# Patient Record
Sex: Male | Born: 1965 | Race: White | Hispanic: No | Smoking: Never smoker
Health system: Southern US, Community
[De-identification: ages and names within clinical notes are randomized; demographics above are authoritative.]

## PROBLEM LIST (undated history)

## (undated) DIAGNOSIS — S2239XA Fracture of one rib, unspecified side, initial encounter for closed fracture: Secondary | ICD-10-CM

## (undated) DIAGNOSIS — Z9889 Other specified postprocedural states: Secondary | ICD-10-CM

---

## 2001-06-29 ENCOUNTER — Encounter: Payer: Self-pay | Admitting: Emergency Medicine

## 2001-06-29 ENCOUNTER — Emergency Department (HOSPITAL_COMMUNITY): Admission: EM | Admit: 2001-06-29 | Discharge: 2001-06-29 | Payer: Self-pay | Admitting: Emergency Medicine

## 2009-07-25 ENCOUNTER — Emergency Department (HOSPITAL_COMMUNITY): Admission: EM | Admit: 2009-07-25 | Discharge: 2009-07-25 | Payer: Self-pay | Admitting: Emergency Medicine

## 2010-07-29 ENCOUNTER — Emergency Department (HOSPITAL_COMMUNITY)
Admission: EM | Admit: 2010-07-29 | Discharge: 2010-07-29 | Disposition: A | Discharge status: Home / Self Care | Payer: PRIVATE HEALTH INSURANCE | Attending: Emergency Medicine | Admitting: Emergency Medicine

## 2010-07-29 DIAGNOSIS — T148XXA Other injury of unspecified body region, initial encounter: Secondary | ICD-10-CM | POA: Insufficient documentation

## 2010-07-29 DIAGNOSIS — M545 Low back pain, unspecified: Secondary | ICD-10-CM | POA: Insufficient documentation

## 2010-07-29 DIAGNOSIS — X500XXA Overexertion from strenuous movement or load, initial encounter: Secondary | ICD-10-CM | POA: Insufficient documentation

## 2010-07-29 DIAGNOSIS — M62838 Other muscle spasm: Secondary | ICD-10-CM | POA: Insufficient documentation

## 2019-03-06 ENCOUNTER — Ambulatory Visit: Payer: PRIVATE HEALTH INSURANCE | Attending: Internal Medicine

## 2019-03-06 ENCOUNTER — Other Ambulatory Visit: Payer: Self-pay

## 2019-03-06 DIAGNOSIS — Z20822 Contact with and (suspected) exposure to covid-19: Secondary | ICD-10-CM

## 2019-03-07 LAB — NOVEL CORONAVIRUS, NAA: SARS-CoV-2, NAA: NOT DETECTED

## 2019-03-08 ENCOUNTER — Telehealth: Payer: Self-pay | Admitting: *Deleted

## 2019-03-08 NOTE — Telephone Encounter (Signed)
Pt called in requesting his COVID-19 test result.   I let him know it was not detected meaning he did not have the virus.  I gave him the [facility] number he needed to call in order to have his result sent to him.   He is not active on MyChart. He thanked me very much for my help.

## 2020-07-15 ENCOUNTER — Encounter (HOSPITAL_COMMUNITY): Payer: Self-pay

## 2020-07-15 ENCOUNTER — Inpatient Hospital Stay (HOSPITAL_COMMUNITY)
Admission: EM | Admit: 2020-07-15 | Discharge: 2020-07-17 | DRG: 494 | Disposition: A | Discharge status: Home / Self Care | Payer: Self-pay | Attending: Student | Admitting: Student

## 2020-07-15 ENCOUNTER — Emergency Department (HOSPITAL_COMMUNITY): Payer: Self-pay

## 2020-07-15 DIAGNOSIS — T148XXA Other injury of unspecified body region, initial encounter: Secondary | ICD-10-CM

## 2020-07-15 DIAGNOSIS — S42402A Unspecified fracture of lower end of left humerus, initial encounter for closed fracture: Secondary | ICD-10-CM | POA: Diagnosis present

## 2020-07-15 DIAGNOSIS — Z23 Encounter for immunization: Secondary | ICD-10-CM

## 2020-07-15 DIAGNOSIS — Z419 Encounter for procedure for purposes other than remedying health state, unspecified: Secondary | ICD-10-CM

## 2020-07-15 DIAGNOSIS — S022XXB Fracture of nasal bones, initial encounter for open fracture: Secondary | ICD-10-CM

## 2020-07-15 DIAGNOSIS — Z20822 Contact with and (suspected) exposure to covid-19: Secondary | ICD-10-CM | POA: Diagnosis present

## 2020-07-15 DIAGNOSIS — Y9355 Activity, bike riding: Secondary | ICD-10-CM

## 2020-07-15 DIAGNOSIS — S022XXA Fracture of nasal bones, initial encounter for closed fracture: Secondary | ICD-10-CM | POA: Diagnosis present

## 2020-07-15 DIAGNOSIS — S42412A Displaced simple supracondylar fracture without intercondylar fracture of left humerus, initial encounter for closed fracture: Secondary | ICD-10-CM | POA: Diagnosis not present

## 2020-07-15 HISTORY — DX: Other specified postprocedural states: Z98.890

## 2020-07-15 LAB — BASIC METABOLIC PANEL
Anion gap: 9 (ref 5–15)
BUN: 14 mg/dL (ref 6–20)
CO2: 24 mmol/L (ref 22–32)
Calcium: 8.9 mg/dL (ref 8.9–10.3)
Chloride: 107 mmol/L (ref 98–111)
Creatinine, Ser: 0.83 mg/dL (ref 0.61–1.24)
GFR, Estimated: >60 mL/min (ref >60)
Glucose, Bld: 103 mg/dL — ABNORMAL HIGH (ref 70–99)
Potassium: 3 mmol/L — ABNORMAL LOW (ref 3.5–5.1)
Sodium: 140 mmol/L (ref 135–145)

## 2020-07-15 LAB — CBC
HCT: 40.8 % (ref 39.0–52.0)
Hemoglobin: 13.9 g/dL (ref 13.0–17.0)
MCH: 32 pg (ref 26.0–34.0)
MCHC: 34.1 g/dL (ref 30.0–36.0)
MCV: 93.8 fL (ref 80.0–100.0)
Platelets: 247 10*3/uL (ref 150–400)
RBC: 4.35 MIL/uL (ref 4.22–5.81)
RDW: 12.6 % (ref 11.5–15.5)
WBC: 14.6 10*3/uL — ABNORMAL HIGH (ref 4.0–10.5)
nRBC: 0 % (ref 0.0–0.2)

## 2020-07-15 LAB — RESP PANEL BY RT-PCR (FLU A&B, COVID) ARPGX2
Influenza A by PCR: NEGATIVE
Influenza B by PCR: NEGATIVE
SARS Coronavirus 2 by RT PCR: NEGATIVE

## 2020-07-15 LAB — HIV ANTIBODY (ROUTINE TESTING W REFLEX): HIV Screen 4th Generation wRfx: NONREACTIVE

## 2020-07-15 MED ORDER — OXYCODONE HCL 5 MG PO TABS
5.0000 mg | ORAL_TABLET | ORAL | Status: DC | PRN
  Administered 2020-07-16: 5 mg via ORAL
  Filled 2020-07-15 (×2): qty 1

## 2020-07-15 MED ORDER — MAGNESIUM CITRATE PO SOLN: 1.0000 | Freq: Once | ORAL | Status: DC | PRN

## 2020-07-15 MED ORDER — ONDANSETRON HCL 4 MG/2ML IJ SOLN
4.0000 mg | Freq: Four times a day (QID) | INTRAMUSCULAR | Status: DC | PRN
  Administered 2020-07-16: 4 mg via INTRAVENOUS

## 2020-07-15 MED ORDER — METHOCARBAMOL 1000 MG/10ML IJ SOLN
500.0000 mg | Freq: Four times a day (QID) | INTRAVENOUS | Status: DC | PRN
  Filled 2020-07-15: qty 5

## 2020-07-15 MED ORDER — TETANUS-DIPHTH-ACELL PERTUSSIS 5-2.5-18.5 LF-MCG/0.5 IM SUSY
0.5000 mL | PREFILLED_SYRINGE | Freq: Once | INTRAMUSCULAR | Status: AC
  Administered 2020-07-15: 0.5 mL via INTRAMUSCULAR
  Filled 2020-07-15: qty 0.5

## 2020-07-15 MED ORDER — ACETAMINOPHEN 325 MG PO TABS: 325.0000 mg | ORAL_TABLET | Freq: Four times a day (QID) | ORAL | Status: DC | PRN

## 2020-07-15 MED ORDER — METHOCARBAMOL 500 MG PO TABS
500.0000 mg | ORAL_TABLET | Freq: Four times a day (QID) | ORAL | Status: DC | PRN
  Administered 2020-07-16: 500 mg via ORAL
  Filled 2020-07-15 (×2): qty 1

## 2020-07-15 MED ORDER — DOCUSATE SODIUM 100 MG PO CAPS
100.0000 mg | ORAL_CAPSULE | Freq: Two times a day (BID) | ORAL | Status: DC
  Filled 2020-07-15 (×2): qty 1

## 2020-07-15 MED ORDER — ONDANSETRON HCL 4 MG PO TABS: 4.0000 mg | ORAL_TABLET | Freq: Four times a day (QID) | ORAL | Status: DC | PRN

## 2020-07-15 MED ORDER — OXYCODONE HCL 5 MG PO TABS: 10.0000 mg | ORAL_TABLET | ORAL | Status: DC | PRN

## 2020-07-15 MED ORDER — POLYETHYLENE GLYCOL 3350 17 G PO PACK: 17.0000 g | PACK | Freq: Every day | ORAL | Status: DC | PRN

## 2020-07-15 MED ORDER — ACETAMINOPHEN 500 MG PO TABS
1000.0000 mg | ORAL_TABLET | Freq: Four times a day (QID) | ORAL | Status: DC
  Administered 2020-07-16: 1000 mg via ORAL
  Filled 2020-07-15: qty 2

## 2020-07-15 MED ORDER — METOCLOPRAMIDE HCL 5 MG/ML IJ SOLN: 5.0000 mg | Freq: Three times a day (TID) | INTRAMUSCULAR | Status: DC | PRN

## 2020-07-15 MED ORDER — HYDROMORPHONE HCL 1 MG/ML IJ SOLN
0.5000 mg | INTRAMUSCULAR | Status: DC | PRN
  Administered 2020-07-15: 1 mg via INTRAVENOUS
  Filled 2020-07-15: qty 1

## 2020-07-15 MED ORDER — SODIUM CHLORIDE 0.9 % IV SOLN: INTRAVENOUS | Status: DC

## 2020-07-15 MED ORDER — FENTANYL CITRATE (PF) 100 MCG/2ML IJ SOLN
50.0000 ug | Freq: Once | INTRAMUSCULAR | Status: AC
  Administered 2020-07-15: 50 ug via INTRAVENOUS
  Filled 2020-07-15: qty 2

## 2020-07-15 MED ORDER — METOCLOPRAMIDE HCL 10 MG PO TABS
5.0000 mg | ORAL_TABLET | Freq: Three times a day (TID) | ORAL | Status: DC | PRN
  Filled 2020-07-15: qty 1

## 2020-07-15 MED ORDER — SORBITOL 70 % SOLN
30.0000 mL | Freq: Every day | Status: DC | PRN
  Filled 2020-07-15: qty 30

## 2020-07-15 MED ORDER — DIPHENHYDRAMINE HCL 12.5 MG/5ML PO ELIX: 12.5000 mg | ORAL_SOLUTION | ORAL | Status: DC | PRN
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
## 2020-07-15 NOTE — Anesthesia Preprocedure Evaluation (Addendum)
Anesthesia Evaluation  Patient identified by MRN, date of birth, ID band Patient awake    Reviewed: Allergy & Precautions, NPO status , Patient's Chart, lab work & pertinent test results  History of Anesthesia Complications Negative for: history of anesthetic complications  Airway Mallampati: II  TM Distance: >3 FB Neck ROM: Full    Dental no notable dental hx. (+) Dental Advisory Given   Pulmonary neg pulmonary ROS,    Pulmonary exam normal        Cardiovascular negative cardio ROS Normal cardiovascular exam     Neuro/Psych negative neurological ROS     GI/Hepatic negative GI ROS, Neg liver ROS,   Endo/Other  negative endocrine ROS  Renal/GU negative Renal ROS     Musculoskeletal   Abdominal   Peds  Hematology negative hematology ROS (+)   Anesthesia Other Findings   Reproductive/Obstetrics                            Anesthesia Physical Anesthesia Plan  ASA: 2  Anesthesia Plan: General   Post-op Pain Management:  Regional for Post-op pain   Induction:   PONV Risk Score and Plan: 2 and Ondansetron and Dexamethasone  Airway Management Planned: Oral ETT and LMA  Additional Equipment:   Intra-op Plan:   Post-operative Plan: Extubation in OR  Informed Consent:   Plan Discussed with: Anesthesiologist and CRNA  Anesthesia Plan Comments:        Anesthesia Quick Evaluation
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
## 2020-07-15 NOTE — Progress Notes (Signed)
Orthopedic Tech Progress Note Patient Details:  Ricardo Lozano 09-03-1965 [id number]  Applied splint In a comfortable position for patient  Ortho Devices Type of Ortho Device: Long arm splint Ortho Device/Splint Location: LUE Ortho Device/Splint Interventions: Ordered, Application, Adjustment   Post Interventions Patient Tolerated: Well Instructions Provided: Care of device  Donald Pore 07/15/2020, 7:34 PM

## 2020-07-16 ENCOUNTER — Observation Stay (HOSPITAL_COMMUNITY): Payer: Self-pay

## 2020-07-16 ENCOUNTER — Observation Stay (HOSPITAL_COMMUNITY): Payer: Self-pay | Admitting: Anesthesiology

## 2020-07-16 ENCOUNTER — Other Ambulatory Visit: Payer: Self-pay

## 2020-07-16 ENCOUNTER — Encounter (HOSPITAL_COMMUNITY)
Admission: EM | Disposition: A | Discharge status: Home / Self Care | Payer: Self-pay | Source: Home / Self Care | Attending: Orthopaedic Surgery

## 2020-07-16 DIAGNOSIS — Y9355 Activity, bike riding: Secondary | ICD-10-CM | POA: Diagnosis not present

## 2020-07-16 DIAGNOSIS — S42402A Unspecified fracture of lower end of left humerus, initial encounter for closed fracture: Secondary | ICD-10-CM | POA: Diagnosis present

## 2020-07-16 DIAGNOSIS — Z20822 Contact with and (suspected) exposure to covid-19: Secondary | ICD-10-CM | POA: Diagnosis present

## 2020-07-16 DIAGNOSIS — S022XXA Fracture of nasal bones, initial encounter for closed fracture: Secondary | ICD-10-CM | POA: Diagnosis present

## 2020-07-16 DIAGNOSIS — S42412A Displaced simple supracondylar fracture without intercondylar fracture of left humerus, initial encounter for closed fracture: Secondary | ICD-10-CM | POA: Diagnosis present

## 2020-07-16 DIAGNOSIS — Z23 Encounter for immunization: Secondary | ICD-10-CM | POA: Diagnosis not present

## 2020-07-16 HISTORY — PX: ORIF HUMERUS FRACTURE: SHX2126

## 2020-07-16 SURGERY — OPEN REDUCTION INTERNAL FIXATION (ORIF) DISTAL HUMERUS FRACTURE
Anesthesia: General | Laterality: Left

## 2020-07-16 MED ORDER — VANCOMYCIN HCL 1000 MG IV SOLR
INTRAVENOUS | Status: AC
  Filled 2020-07-16: qty 1000

## 2020-07-16 MED ORDER — BUPIVACAINE HCL (PF) 0.5 % IJ SOLN
INTRAMUSCULAR | Status: DC | PRN
  Administered 2020-07-16: 15 mL via PERINEURAL

## 2020-07-16 MED ORDER — CELECOXIB 200 MG PO CAPS
200.0000 mg | ORAL_CAPSULE | Freq: Once | ORAL | Status: AC
  Administered 2020-07-16: 200 mg via ORAL
  Filled 2020-07-16: qty 1

## 2020-07-16 MED ORDER — MIDAZOLAM HCL 2 MG/2ML IJ SOLN
INTRAMUSCULAR | Status: AC
  Filled 2020-07-16: qty 2

## 2020-07-16 MED ORDER — ACETAMINOPHEN 500 MG PO TABS
1000.0000 mg | ORAL_TABLET | Freq: Four times a day (QID) | ORAL | Status: DC
  Administered 2020-07-16 – 2020-07-17 (×4): 1000 mg via ORAL
  Filled 2020-07-16 (×4): qty 2

## 2020-07-16 MED ORDER — ASPIRIN EC 81 MG PO TBEC
81.0000 mg | DELAYED_RELEASE_TABLET | Freq: Every day | ORAL | Status: DC
  Administered 2020-07-17: 81 mg via ORAL
  Filled 2020-07-16: qty 1

## 2020-07-16 MED ORDER — AMISULPRIDE (ANTIEMETIC) 5 MG/2ML IV SOLN: 10.0000 mg | Freq: Once | INTRAVENOUS | Status: DC | PRN

## 2020-07-16 MED ORDER — PHENYLEPHRINE HCL-NACL 10-0.9 MG/250ML-% IV SOLN
INTRAVENOUS | Status: DC | PRN
  Administered 2020-07-16: 30 ug/min via INTRAVENOUS

## 2020-07-16 MED ORDER — CEFAZOLIN SODIUM-DEXTROSE 2-4 GM/100ML-% IV SOLN
INTRAVENOUS | Status: AC
  Filled 2020-07-16: qty 100

## 2020-07-16 MED ORDER — FENTANYL CITRATE (PF) 250 MCG/5ML IJ SOLN
INTRAMUSCULAR | Status: DC | PRN
  Administered 2020-07-16: 100 ug via INTRAVENOUS

## 2020-07-16 MED ORDER — MIDAZOLAM HCL 2 MG/2ML IJ SOLN
INTRAMUSCULAR | Status: DC | PRN
  Administered 2020-07-16: 2 mg via INTRAVENOUS

## 2020-07-16 MED ORDER — VANCOMYCIN HCL 1000 MG IV SOLR
INTRAVENOUS | Status: DC | PRN
  Administered 2020-07-16: 1000 mg

## 2020-07-16 MED ORDER — FENTANYL CITRATE (PF) 250 MCG/5ML IJ SOLN
INTRAMUSCULAR | Status: AC
  Filled 2020-07-16: qty 5

## 2020-07-16 MED ORDER — CEFAZOLIN SODIUM-DEXTROSE 2-3 GM-%(50ML) IV SOLR
INTRAVENOUS | Status: DC | PRN
  Administered 2020-07-16: 2 g via INTRAVENOUS

## 2020-07-16 MED ORDER — ACETAMINOPHEN 500 MG PO TABS
1000.0000 mg | ORAL_TABLET | Freq: Once | ORAL | Status: DC
  Filled 2020-07-16: qty 2

## 2020-07-16 MED ORDER — SUGAMMADEX SODIUM 200 MG/2ML IV SOLN
INTRAVENOUS | Status: DC | PRN
  Administered 2020-07-16: 200 mg via INTRAVENOUS

## 2020-07-16 MED ORDER — FENTANYL CITRATE (PF) 100 MCG/2ML IJ SOLN
INTRAMUSCULAR | Status: AC
  Filled 2020-07-16: qty 2

## 2020-07-16 MED ORDER — 0.9 % SODIUM CHLORIDE (POUR BTL) OPTIME
TOPICAL | Status: DC | PRN
  Administered 2020-07-16: 1000 mL

## 2020-07-16 MED ORDER — PHENYLEPHRINE 40 MCG/ML (10ML) SYRINGE FOR IV PUSH (FOR BLOOD PRESSURE SUPPORT)
PREFILLED_SYRINGE | INTRAVENOUS | Status: DC | PRN
  Administered 2020-07-16 (×2): 40 ug via INTRAVENOUS

## 2020-07-16 MED ORDER — LACTATED RINGERS IV SOLN: INTRAVENOUS | Status: DC | PRN

## 2020-07-16 MED ORDER — PROMETHAZINE HCL 25 MG/ML IJ SOLN: 6.2500 mg | INTRAMUSCULAR | Status: DC | PRN

## 2020-07-16 MED ORDER — DEXAMETHASONE SODIUM PHOSPHATE 10 MG/ML IJ SOLN
INTRAMUSCULAR | Status: DC | PRN
  Administered 2020-07-16: 10 mg via INTRAVENOUS

## 2020-07-16 MED ORDER — CEFAZOLIN SODIUM-DEXTROSE 2-4 GM/100ML-% IV SOLN
2.0000 g | Freq: Three times a day (TID) | INTRAVENOUS | Status: AC
  Administered 2020-07-16 – 2020-07-17 (×3): 2 g via INTRAVENOUS
  Filled 2020-07-16 (×3): qty 100

## 2020-07-16 MED ORDER — BUPIVACAINE LIPOSOME 1.3 % IJ SUSP
INTRAMUSCULAR | Status: DC | PRN
  Administered 2020-07-16: 10 mL via PERINEURAL

## 2020-07-16 MED ORDER — ROCURONIUM BROMIDE 10 MG/ML (PF) SYRINGE
PREFILLED_SYRINGE | INTRAVENOUS | Status: DC | PRN
  Administered 2020-07-16: 100 mg via INTRAVENOUS

## 2020-07-16 MED ORDER — FENTANYL CITRATE (PF) 100 MCG/2ML IJ SOLN: 25.0000 ug | INTRAMUSCULAR | Status: DC | PRN

## 2020-07-16 MED ORDER — PROPOFOL 10 MG/ML IV BOLUS
INTRAVENOUS | Status: AC
  Filled 2020-07-16: qty 20

## 2020-07-16 MED ORDER — PROPOFOL 10 MG/ML IV BOLUS
INTRAVENOUS | Status: DC | PRN
  Administered 2020-07-16: 200 mg via INTRAVENOUS

## 2020-07-16 SURGICAL SUPPLY — 93 items
ADH SKN CLS APL DERMABOND .7 (GAUZE/BANDAGES/DRESSINGS) ×1
BAG COUNTER SPONGE SURGICOUNT (BAG) ×2 IMPLANT
BAG SPNG CNTER NS LX DISP (BAG) ×1
BIT DRILL 2.0 (BIT) ×2
BIT DRILL 2.5X2.75 QC CALB (BIT) ×2 IMPLANT
BIT DRILL 2XNS DISP SS SM FRAG (BIT) ×1 IMPLANT
BIT DRILL 4.8X200 CANN (BIT) ×2 IMPLANT
BIT DRILL CALIBRATED 2.7 (BIT) ×4 IMPLANT
BIT DRL 2XNS DISP SS SM FRAG (BIT) ×1
BLADE AVERAGE 25X9 (BLADE) IMPLANT
BNDG COHESIVE 4X5 TAN STRL (GAUZE/BANDAGES/DRESSINGS) ×2 IMPLANT
BNDG ELASTIC 3X5.8 VLCR STR LF (GAUZE/BANDAGES/DRESSINGS) ×2 IMPLANT
BNDG ELASTIC 4X5.8 VLCR STR LF (GAUZE/BANDAGES/DRESSINGS) ×2 IMPLANT
BNDG ESMARK 4X9 LF (GAUZE/BANDAGES/DRESSINGS) ×2 IMPLANT
BNDG GAUZE ELAST 4 BULKY (GAUZE/BANDAGES/DRESSINGS) ×4 IMPLANT
BRUSH SCRUB EZ PLAIN DRY (MISCELLANEOUS) ×2 IMPLANT
CLEANER TIP ELECTROSURG 2X2 (MISCELLANEOUS) ×2 IMPLANT
CORD BIPOLAR FORCEPS 12FT (ELECTRODE) IMPLANT
COVER SURGICAL LIGHT HANDLE (MISCELLANEOUS) ×2 IMPLANT
DERMABOND ADVANCED (GAUZE/BANDAGES/DRESSINGS) ×1
DERMABOND ADVANCED .7 DNX12 (GAUZE/BANDAGES/DRESSINGS) ×1 IMPLANT
DRAIN PENROSE 1/4X12 LTX STRL (WOUND CARE) IMPLANT
DRAPE C-ARM 42X72 X-RAY (DRAPES) ×2 IMPLANT
DRAPE C-ARMOR (DRAPES) ×2 IMPLANT
DRAPE HALF SHEET 40X57 (DRAPES) ×2 IMPLANT
DRAPE INCISE IOBAN 66X45 STRL (DRAPES) IMPLANT
DRAPE ORTHO SPLIT 77X108 STRL (DRAPES) ×4
DRAPE SURG ORHT 6 SPLT 77X108 (DRAPES) ×2 IMPLANT
DRAPE U-SHAPE 47X51 STRL (DRAPES) ×4 IMPLANT
DRSG ADAPTIC 3X8 NADH LF (GAUZE/BANDAGES/DRESSINGS) IMPLANT
DRSG MEPILEX BORDER 4X12 (GAUZE/BANDAGES/DRESSINGS) ×2 IMPLANT
DRSG PAD ABDOMINAL 8X10 ST (GAUZE/BANDAGES/DRESSINGS) ×2 IMPLANT
ELECT REM PT RETURN 9FT ADLT (ELECTROSURGICAL) ×2
ELECTRODE REM PT RTRN 9FT ADLT (ELECTROSURGICAL) ×1 IMPLANT
GAUZE SPONGE 4X4 12PLY STRL (GAUZE/BANDAGES/DRESSINGS) ×4 IMPLANT
GLOVE SURG ENC MOIS LTX SZ6.5 (GLOVE) ×6 IMPLANT
GLOVE SURG ENC MOIS LTX SZ7.5 (GLOVE) ×6 IMPLANT
GLOVE SURG UNDER POLY LF SZ6.5 (GLOVE) ×2 IMPLANT
GLOVE SURG UNDER POLY LF SZ7.5 (GLOVE) ×2 IMPLANT
GOWN STRL REUS W/ TWL LRG LVL3 (GOWN DISPOSABLE) ×3 IMPLANT
GOWN STRL REUS W/ TWL XL LVL3 (GOWN DISPOSABLE) IMPLANT
GOWN STRL REUS W/TWL LRG LVL3 (GOWN DISPOSABLE) ×6
GOWN STRL REUS W/TWL XL LVL3 (GOWN DISPOSABLE)
K-WIRE FIXATION 2.0X6 (WIRE) ×4
KIT BASIN OR (CUSTOM PROCEDURE TRAY) ×2 IMPLANT
KIT TURNOVER KIT B (KITS) ×2 IMPLANT
KWIRE FIXATION 2.0X6 (WIRE) ×2 IMPLANT
LOOP VESSEL MAXI BLUE (MISCELLANEOUS) ×2 IMPLANT
MANIFOLD NEPTUNE II (INSTRUMENTS) ×2 IMPLANT
NEEDLE HYPO 25X1 1.5 SAFETY (NEEDLE) IMPLANT
NS IRRIG 1000ML POUR BTL (IV SOLUTION) ×2 IMPLANT
PACK ORTHO EXTREMITY (CUSTOM PROCEDURE TRAY) ×2 IMPLANT
PAD ARMBOARD 7.5X6 YLW CONV (MISCELLANEOUS) ×4 IMPLANT
PIN GUIDE DRILL TIP 2.8X300 (DRILL) ×2 IMPLANT
PLATE LG LF MEDIAL DIS HUMERUS (Plate) ×2 IMPLANT
PLATE LG LF POST LAT DIS HUMER (Plate) ×2 IMPLANT
SCREW CANNULATED 6.5X115MM (Screw) ×2 IMPLANT
SCREW CORTICAL 2.7 MM 22MM (Screw) ×2 IMPLANT
SCREW CORTICAL 2.7MM  24MM (Screw) ×4 IMPLANT
SCREW CORTICAL 2.7MM  40MM (Screw) ×2 IMPLANT
SCREW CORTICAL 2.7MM 24MM (Screw) ×2 IMPLANT
SCREW CORTICAL 2.7MM 40MM (Screw) ×1 IMPLANT
SCREW CORTICAL 2.7MM 50MM (Screw) ×2 IMPLANT
SCREW CORTICAL 3.5MM  30MM (Screw) ×2 IMPLANT
SCREW CORTICAL 3.5MM 22MM (Screw) ×2 IMPLANT
SCREW CORTICAL 3.5MM 24MM (Screw) ×10 IMPLANT
SCREW CORTICAL 3.5MM 26MM (Screw) ×2 IMPLANT
SCREW CORTICAL 3.5MM 30MM (Screw) ×1 IMPLANT
SCREW LOCK CORT STAR 3.5X18 (Screw) ×8 IMPLANT
SCREW LOCK CORT STAR 3.5X22 (Screw) ×2 IMPLANT
SCREW LOCK CORT STAR 3.5X48 (Screw) ×4 IMPLANT
SCREW LOW PROF TIS 3.5X28MM (Screw) ×2 IMPLANT
SCREW LP NL T15 3.5X24 (Screw) ×2 IMPLANT
SCREW T15 MD 3.5X48MM NS (Screw) ×2 IMPLANT
SLING ARM IMMOBILIZER LRG (SOFTGOODS) ×2 IMPLANT
SPONGE T-LAP 18X18 ~~LOC~~+RFID (SPONGE) ×4 IMPLANT
STAPLER VISISTAT 35W (STAPLE) IMPLANT
STOCKINETTE IMPERVIOUS 9X36 MD (GAUZE/BANDAGES/DRESSINGS) ×2 IMPLANT
SUCTION FRAZIER HANDLE 10FR (MISCELLANEOUS) ×2
SUCTION TUBE FRAZIER 10FR DISP (MISCELLANEOUS) ×1 IMPLANT
SUT ETHILON 3 0 PS 1 (SUTURE) ×4 IMPLANT
SUT MNCRL AB 3-0 PS2 27 (SUTURE) ×2 IMPLANT
SUT VIC AB 0 CT1 27 (SUTURE) ×4
SUT VIC AB 0 CT1 27XBRD ANBCTR (SUTURE) ×2 IMPLANT
SUT VIC AB 2-0 CT1 27 (SUTURE) ×4
SUT VIC AB 2-0 CT1 TAPERPNT 27 (SUTURE) ×2 IMPLANT
SYR CONTROL 10ML LL (SYRINGE) IMPLANT
TOWEL GREEN STERILE (TOWEL DISPOSABLE) ×2 IMPLANT
TOWEL GREEN STERILE FF (TOWEL DISPOSABLE) ×2 IMPLANT
TRAY FOLEY MTR SLVR 16FR STAT (SET/KITS/TRAYS/PACK) ×2 IMPLANT
WASHER FLAT 6.5MM (Washer) ×2 IMPLANT
WATER STERILE IRR 1000ML POUR (IV SOLUTION) ×2 IMPLANT
YANKAUER SUCT BULB TIP NO VENT (SUCTIONS) IMPLANT

## 2020-07-16 NOTE — Anesthesia Procedure Notes (Addendum)
Anesthesia Regional Block: Supraclavicular block   Pre-Anesthetic Checklist: , timeout performed,  Correct Patient, Correct Site, Correct Laterality,  Correct Procedure, Correct Position, site marked,  Risks and benefits discussed,  Surgical consent,  Pre-op evaluation,  At surgeon's request and post-op pain management  Laterality: Left  Prep: chloraprep       Needles:  Injection technique: Single-shot  Needle Type: Echogenic Stimulator Needle     Needle Length: 5cm  Needle Gauge: 22     Additional Needles:   Narrative:  Start time: 07/16/2020 8:11 AM End time: 07/16/2020 8:01 AM Injection made incrementally with aspirations every 5 mL.  Performed by: Personally  Anesthesiologist: Heather Roberts, MD  Additional Notes: Functioning IV was confirmed and monitors applied.  A 13mm 22ga echogenic arrow stimulator was used. Sterile prep and drape,hand hygiene and sterile gloves were used.Ultrasound guidance: relevant anatomy identified, needle position confirmed, local anesthetic spread visualized around nerve(s)., vascular puncture avoided.  Image printed for medical record.  Negative aspiration and negative test dose prior to incremental administration of local anesthetic. The patient tolerated the procedure well.

## 2020-07-16 NOTE — H&P (Signed)
I reviewed the case with Dr. Jena Gauss last night and he has agreed to perform surgical repair this morning.
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
## 2020-07-16 NOTE — Discharge Instructions (Addendum)
Orthopaedic Trauma Service Discharge Instructions   General Discharge Instructions  WEIGHT BEARING STATUS: Non-weightbearing left upper extremity  RANGE OF MOTION/ACTIVITY:ok for passive and active range of motion of elbow. Wear sling for comfort  Wound Care: You may remove your surgical dressing on po-op day #2 (Friday 07/18/20). Incisions can be left open to air if there is no drainage. If incision continues to have drainage, follow wound care instructions below. Okay to shower if no drainage from incisions.  DVT/PE prophylaxis: Aspirin  Diet: as you were eating previously.  Can use over the counter stool softeners and bowel preparations, such as Miralax, to help with bowel movements.  Narcotics can be constipating.  Be sure to drink plenty of fluids  PAIN MEDICATION USE AND EXPECTATIONS  You have likely been given narcotic medications to help control your pain.  After a traumatic event that results in an fracture (broken bone) with or without surgery, it is ok to use narcotic pain medications to help control one's pain.  We understand that everyone responds to pain differently and each individual patient will be evaluated on a regular basis for the continued need for narcotic medications. Ideally, narcotic medication use should last no more than 6-8 weeks (coinciding with fracture healing).   As a patient it is your responsibility as well to monitor narcotic medication use and report the amount and frequency you use these medications when you come to your office visit.   We would also advise that if you are using narcotic medications, you should take a dose prior to therapy to maximize you participation.  IF YOU ARE ON NARCOTIC MEDICATIONS IT IS NOT PERMISSIBLE TO OPERATE A MOTOR VEHICLE (MOTORCYCLE/CAR/TRUCK/MOPED) OR HEAVY MACHINERY DO NOT MIX NARCOTICS WITH OTHER CNS (CENTRAL NERVOUS SYSTEM) DEPRESSANTS SUCH AS ALCOHOL   STOP SMOKING OR USING NICOTINE PRODUCTS!!!!  As discussed  nicotine severely impairs your body's ability to heal surgical and traumatic wounds but also impairs bone healing.  Wounds and bone heal by forming microscopic blood vessels (angiogenesis) and nicotine is a vasoconstrictor (essentially, shrinks blood vessels).  Therefore, if vasoconstriction occurs to these microscopic blood vessels they essentially disappear and are unable to deliver necessary nutrients to the healing tissue.  This is one modifiable factor that you can do to dramatically increase your chances of healing your injury.    (This means no smoking, no nicotine gum, patches, etc)  DO NOT USE NONSTEROIDAL ANTI-INFLAMMATORY DRUGS (NSAID'S)  Using products such as Advil (ibuprofen), Aleve (naproxen), Motrin (ibuprofen) for additional pain control during fracture healing can delay and/or prevent the healing response.  If you would like to take over the counter (OTC) medication, Tylenol (acetaminophen) is ok.  However, some narcotic medications that are given for pain control contain acetaminophen as well. Therefore, you should not exceed more than 4000 mg of tylenol in a day if you do not have liver disease.  Also note that there are may OTC medicines, such as cold medicines and allergy medicines that my contain tylenol as well.  If you have any questions about medications and/or interactions please ask your doctor/PA or your pharmacist.      ICE AND ELEVATE INJURED/OPERATIVE EXTREMITY  Using ice and elevating the injured extremity above your heart can help with swelling and pain control.  Icing in a pulsatile fashion, such as 20 minutes on and 20 minutes off, can be followed.    Do not place ice directly on skin. Make sure there is a barrier between to skin  and the ice pack.    Using frozen items such as frozen peas works well as the conform nicely to the are that needs to be iced.  USE AN ACE WRAP OR TED HOSE FOR SWELLING CONTROL  In addition to icing and elevation, Ace wraps or TED hose are  used to help limit and resolve swelling.  It is recommended to use Ace wraps or TED hose until you are informed to stop.    When using Ace Wraps start the wrapping distally (farthest away from the body) and wrap proximally (closer to the body)   Example: If you had surgery on your leg or thing and you do not have a splint on, start the ace wrap at the toes and work your way up to the thigh        If you had surgery on your upper extremity and do not have a splint on, start the ace wrap at your fingers and work your way up to the upper arm  CALL THE OFFICE WITH ANY QUESTIONS OR CONCERNS: [phone]   VISIT OUR WEBSITE FOR ADDITIONAL INFORMATION: [email or web address]     Discharge Wound Care Instructions  Do NOT apply any ointments, solutions or lotions to pin sites or surgical wounds.  These prevent needed drainage and even though solutions like hydrogen peroxide kill bacteria, they also damage cells lining the pin sites that help fight infection.  Applying lotions or ointments can keep the wounds moist and can cause them to breakdown and open up as well. This can increase the risk for infection. When in doubt call the office.  If any drainage is noted, use one layer of adaptic, then gauze, Kerlix, and an ace wrap.  Once the incision is completely dry and without drainage, it may be left open to air out.  Showering may begin 36-48 hours later.  Cleaning gently with soap and water.  Traumatic wounds should be dressed daily as well.    One layer of adaptic, gauze, Kerlix, then ace wrap.  The adaptic can be discontinued once the draining has ceased    If you have a wet to dry dressing: wet the gauze with saline the squeeze as much saline out so the gauze is moist (not soaking wet), place moistened gauze over wound, then place a dry gauze over the moist one, followed by Kerlix wrap, then ace wrap.

## 2020-07-16 NOTE — Transfer of Care (Signed)
Immediate Anesthesia Transfer of Care Note  Patient: Ricardo Lozano  Procedure(s) Performed: OPEN REDUCTION INTERNAL FIXATION (ORIF) DISTAL HUMERUS FRACTURE (Left)  Patient Location: PACU  Anesthesia Type:GA combined with regional for post-op pain  Level of Consciousness: awake, alert  and oriented  Airway & Oxygen Therapy: Patient Spontanous Breathing and Patient connected to nasal cannula oxygen  Post-op Assessment: Report given to RN and Post -op Vital signs reviewed and stable  Post vital signs: Reviewed and stable  Last Vitals:  Vitals Value Taken Time  BP 132/84 07/16/20 1154  Temp    Pulse 65 07/16/20 1157  Resp 13 07/16/20 1157  SpO2 100 % 07/16/20 1157  Vitals shown include unvalidated device data.  Last Pain:  Vitals:   07/16/20 0707  TempSrc:   PainSc: 0-No pain         Complications: No notable events documented.
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
## 2020-07-16 NOTE — Anesthesia Postprocedure Evaluation (Signed)
Anesthesia Post Note  Patient: Ricardo Lozano  Procedure(s) Performed: OPEN REDUCTION INTERNAL FIXATION (ORIF) DISTAL HUMERUS FRACTURE (Left)     Patient location during evaluation: PACU Anesthesia Type: General Level of consciousness: sedated Pain management: pain level controlled Vital Signs Assessment: post-procedure vital signs reviewed and stable Respiratory status: spontaneous breathing and respiratory function stable Cardiovascular status: stable Postop Assessment: no apparent nausea or vomiting Anesthetic complications: no   No notable events documented.  Last Vitals:  Vitals:   07/16/20 1254 07/16/20 1324  BP: 121/75 124/83  Pulse: 65 63  Resp: 14 13  Temp:    SpO2: 100% 100%    Last Pain:  Vitals:   07/16/20 1224  TempSrc:   PainSc: 0-No pain                 Azari Hasler DANIEL

## 2020-07-16 NOTE — Anesthesia Procedure Notes (Signed)
Procedure Name: Intubation Date/Time: 07/16/2020 8:46 AM Performed by: Marena Chancy, CRNA Pre-anesthesia Checklist: Patient identified, Emergency Drugs available, Suction available and Patient being monitored Patient Re-evaluated:Patient Re-evaluated prior to induction Oxygen Delivery Method: Circle System Utilized Preoxygenation: Pre-oxygenation with 100% oxygen Induction Type: IV induction Ventilation: Mask ventilation without difficulty Laryngoscope Size: Miller and 2 Grade View: Grade I Tube type: Oral Tube size: 8.0 mm Number of attempts: 1 Airway Equipment and Method: Stylet and Oral airway Placement Confirmation: ETT inserted through vocal cords under direct vision, positive ETCO2 and breath sounds checked- equal and bilateral Tube secured with: Tape Dental Injury: Teeth and Oropharynx as per pre-operative assessment
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
## 2020-07-17 LAB — CBC
HCT: 31.6 % — ABNORMAL LOW (ref 39.0–52.0)
Hemoglobin: 10.5 g/dL — ABNORMAL LOW (ref 13.0–17.0)
MCH: 31.5 pg (ref 26.0–34.0)
MCHC: 33.2 g/dL (ref 30.0–36.0)
MCV: 94.9 fL (ref 80.0–100.0)
Platelets: 182 10*3/uL (ref 150–400)
RBC: 3.33 MIL/uL — ABNORMAL LOW (ref 4.22–5.81)
RDW: 12.7 % (ref 11.5–15.5)
WBC: 10.7 10*3/uL — ABNORMAL HIGH (ref 4.0–10.5)
nRBC: 0 % (ref 0.0–0.2)

## 2020-07-17 LAB — VITAMIN D 25 HYDROXY (VIT D DEFICIENCY, FRACTURES): Vit D, 25-Hydroxy: 28.7 ng/mL — ABNORMAL LOW (ref 30–100)

## 2020-07-17 MED ORDER — OXYCODONE-ACETAMINOPHEN 5-325 MG PO TABS: 1.0000 | ORAL_TABLET | ORAL | 0 refills | Status: DC | PRN

## 2020-07-17 MED ORDER — ASPIRIN 81 MG PO TBEC: 81.0000 mg | DELAYED_RELEASE_TABLET | Freq: Every day | ORAL | 0 refills | Status: AC
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
## 2020-07-17 NOTE — Progress Notes (Signed)
Provided discharge education/instructions, all questions and concerns addressed, Pt not in distress, discharged home with belongings accompanied by son. 

## 2020-07-17 NOTE — Plan of Care (Signed)

## 2020-07-18 ENCOUNTER — Encounter (HOSPITAL_COMMUNITY): Payer: Self-pay | Admitting: Student
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
## 2023-09-22 ENCOUNTER — Emergency Department (HOSPITAL_COMMUNITY): Payer: Self-pay

## 2023-09-22 ENCOUNTER — Emergency Department (HOSPITAL_COMMUNITY)
Admission: EM | Admit: 2023-09-22 | Discharge: 2023-09-22 | Disposition: A | Discharge status: Home / Self Care | Payer: Self-pay | Attending: Emergency Medicine | Admitting: Emergency Medicine

## 2023-09-22 ENCOUNTER — Encounter (HOSPITAL_COMMUNITY): Payer: Self-pay

## 2023-09-22 DIAGNOSIS — S20212A Contusion of left front wall of thorax, initial encounter: Secondary | ICD-10-CM | POA: Insufficient documentation

## 2023-09-22 DIAGNOSIS — S02601A Fracture of unspecified part of body of right mandible, initial encounter for closed fracture: Secondary | ICD-10-CM | POA: Insufficient documentation

## 2023-09-22 DIAGNOSIS — Z23 Encounter for immunization: Secondary | ICD-10-CM | POA: Insufficient documentation

## 2023-09-22 MED ORDER — TETANUS-DIPHTH-ACELL PERTUSSIS 5-2.5-18.5 LF-MCG/0.5 IM SUSY
0.5000 mL | PREFILLED_SYRINGE | Freq: Once | INTRAMUSCULAR | Status: AC
Start: 2023-09-22 — End: 2023-09-22
  Administered 2023-09-22: 0.5 mL via INTRAMUSCULAR
  Filled 2023-09-22: qty 0.5

## 2023-09-22 MED ORDER — IBUPROFEN 800 MG PO TABS: 800.0000 mg | ORAL_TABLET | Freq: Three times a day (TID) | ORAL | 0 refills | Status: DC | PRN
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
## 2023-09-22 NOTE — Discharge Instructions (Signed)
 Eat only soft foods and liquids.  Follow-up with Dr. Anice in the next couple weeks to recheck your jaw.  Take the Motrin  as needed for pain is not relieved by Tylenol  alone

## 2023-09-22 NOTE — ED Notes (Signed)
 Pt ambulatory to waiting room. Pt verbalized understanding of discharge instructions.

## 2023-10-20 ENCOUNTER — Inpatient Hospital Stay (HOSPITAL_COMMUNITY)
Admission: EM | Admit: 2023-10-20 | Discharge: 2023-10-28 | DRG: 141 | Disposition: A | Discharge status: Home / Self Care | Payer: MEDICAID | Attending: Family Medicine | Admitting: Family Medicine

## 2023-10-20 ENCOUNTER — Encounter (HOSPITAL_COMMUNITY)
Admission: EM | Disposition: A | Discharge status: Home / Self Care | Payer: Self-pay | Source: Home / Self Care | Attending: Internal Medicine

## 2023-10-20 ENCOUNTER — Other Ambulatory Visit: Payer: Self-pay

## 2023-10-20 ENCOUNTER — Emergency Department (HOSPITAL_COMMUNITY): Payer: MEDICAID

## 2023-10-20 ENCOUNTER — Encounter (HOSPITAL_COMMUNITY): Payer: Self-pay

## 2023-10-20 ENCOUNTER — Institutional Professional Consult (permissible substitution) (INDEPENDENT_AMBULATORY_CARE_PROVIDER_SITE_OTHER): Payer: MEDICAID | Admitting: Physician Assistant

## 2023-10-20 DIAGNOSIS — S02601A Fracture of unspecified part of body of right mandible, initial encounter for closed fracture: Secondary | ICD-10-CM | POA: Diagnosis not present

## 2023-10-20 DIAGNOSIS — S02601S Fracture of unspecified part of body of right mandible, sequela: Secondary | ICD-10-CM

## 2023-10-20 DIAGNOSIS — M2629 Other anomalies of dental arch relationship: Secondary | ICD-10-CM | POA: Diagnosis present

## 2023-10-20 DIAGNOSIS — S02651A Fracture of angle of right mandible, initial encounter for closed fracture: Principal | ICD-10-CM | POA: Diagnosis present

## 2023-10-20 DIAGNOSIS — K122 Cellulitis and abscess of mouth: Secondary | ICD-10-CM | POA: Diagnosis present

## 2023-10-20 DIAGNOSIS — S02609A Fracture of mandible, unspecified, initial encounter for closed fracture: Secondary | ICD-10-CM | POA: Diagnosis not present

## 2023-10-20 DIAGNOSIS — L0201 Cutaneous abscess of face: Principal | ICD-10-CM | POA: Diagnosis present

## 2023-10-20 DIAGNOSIS — E876 Hypokalemia: Secondary | ICD-10-CM | POA: Diagnosis present

## 2023-10-20 DIAGNOSIS — K011 Impacted teeth: Secondary | ICD-10-CM | POA: Diagnosis present

## 2023-10-20 HISTORY — PX: ORIF MANDIBULAR FRACTURE: SHX2127

## 2023-10-20 HISTORY — PX: IRRIGATION AND DEBRIDEMENT ABSCESS: SHX5252

## 2023-10-20 LAB — COMPREHENSIVE METABOLIC PANEL WITH GFR
ALT: 14 U/L (ref 0–44)
AST: 15 U/L (ref 15–41)
Albumin: 3.1 g/dL — ABNORMAL LOW (ref 3.5–5.0)
Alkaline Phosphatase: 86 U/L (ref 38–126)
Anion gap: 14 (ref 5–15)
BUN: 15 mg/dL (ref 6–20)
CO2: 24 mmol/L (ref 22–32)
Calcium: 9.2 mg/dL (ref 8.9–10.3)
Chloride: 99 mmol/L (ref 98–111)
Creatinine, Ser: 0.88 mg/dL (ref 0.61–1.24)
GFR, Estimated: >60 mL/min (ref >60)
Glucose, Bld: 107 mg/dL — ABNORMAL HIGH (ref 70–99)
Potassium: 3.2 mmol/L — ABNORMAL LOW (ref 3.5–5.1)
Sodium: 137 mmol/L (ref 135–145)
Total Bilirubin: 1.2 mg/dL (ref 0.0–1.2)
Total Protein: 7 g/dL (ref 6.5–8.1)

## 2023-10-20 LAB — CBC WITH DIFFERENTIAL/PLATELET
Abs Immature Granulocytes: 0.04 K/uL (ref 0.00–0.07)
Basophils Absolute: 0 K/uL (ref 0.0–0.1)
Basophils Relative: 0 %
Eosinophils Absolute: 0.1 K/uL (ref 0.0–0.5)
Eosinophils Relative: 1 %
HCT: 35.9 % — ABNORMAL LOW (ref 39.0–52.0)
Hemoglobin: 12.5 g/dL — ABNORMAL LOW (ref 13.0–17.0)
Immature Granulocytes: 0 %
Lymphocytes Relative: 15 %
Lymphs Abs: 1.7 K/uL (ref 0.7–4.0)
MCH: 31.1 pg (ref 26.0–34.0)
MCHC: 34.8 g/dL (ref 30.0–36.0)
MCV: 89.3 fL (ref 80.0–100.0)
Monocytes Absolute: 1.2 K/uL — ABNORMAL HIGH (ref 0.1–1.0)
Monocytes Relative: 11 %
Neutro Abs: 8.1 K/uL — ABNORMAL HIGH (ref 1.7–7.7)
Neutrophils Relative %: 73 %
Platelets: 289 K/uL (ref 150–400)
RBC: 4.02 MIL/uL — ABNORMAL LOW (ref 4.22–5.81)
RDW: 11.9 % (ref 11.5–15.5)
WBC: 11.2 K/uL — ABNORMAL HIGH (ref 4.0–10.5)
nRBC: 0 % (ref 0.0–0.2)

## 2023-10-20 LAB — I-STAT CG4 LACTIC ACID, ED: Lactic Acid, Venous: 0.7 mmol/L (ref 0.5–1.9)

## 2023-10-20 SURGERY — IRRIGATION AND DEBRIDEMENT ABSCESS
Anesthesia: General

## 2023-10-20 MED ORDER — POTASSIUM CHLORIDE CRYS ER 20 MEQ PO TBCR
40.0000 meq | EXTENDED_RELEASE_TABLET | Freq: Once | ORAL | Status: AC
  Administered 2023-10-20: 40 meq via ORAL
  Filled 2023-10-20: qty 2

## 2023-10-20 MED ORDER — SODIUM CHLORIDE 0.9 % IV SOLN
3.0000 g | Freq: Once | INTRAVENOUS | Status: AC
  Administered 2023-10-20: 3 g via INTRAVENOUS
  Filled 2023-10-20: qty 8

## 2023-10-20 MED ORDER — IOHEXOL 350 MG/ML SOLN
75.0000 mL | Freq: Once | INTRAVENOUS | Status: AC | PRN
Start: 2023-10-20 — End: 2023-10-20
  Administered 2023-10-20: 75 mL via INTRAVENOUS

## 2023-10-20 SURGICAL SUPPLY — 53 items
BAG COUNTER SPONGE SURGICOUNT (BAG) ×1 IMPLANT
BAR FIX PREFORMED OMNIMAX (Miscellaneous) IMPLANT
BLADE SURG 10 STRL SS (BLADE) ×1 IMPLANT
BLADE SURG 15 STRL LF DISP TIS (BLADE) IMPLANT
CANISTER SUCTION 3000ML PPV (SUCTIONS) ×1 IMPLANT
CLEANER TIP ELECTROSURG 2X2 (MISCELLANEOUS) ×1 IMPLANT
CNTNR URN SCR LID CUP LEK RST (MISCELLANEOUS) ×1 IMPLANT
COVER SURGICAL LIGHT HANDLE (MISCELLANEOUS) ×2 IMPLANT
DRAIN PENROSE .5X12 LATEX STL (DRAIN) IMPLANT
DRAPE HALF SHEET 40X57 (DRAPES) IMPLANT
DRAPE INCISE 13X13 STRL (DRAPES) IMPLANT
DRIVER SURG ZDRIVE HIGH TORQUE (ORTHOPEDIC DISPOSABLE SUPPLIES) IMPLANT
ELECT COATED BLADE 2.86 ST (ELECTRODE) ×1 IMPLANT
ELECT NDL BLADE 2-5/6 (NEEDLE) IMPLANT
ELECT NEEDLE BLADE 2-5/6 (NEEDLE) IMPLANT
ELECTRODE REM PT RTRN 9FT ADLT (ELECTROSURGICAL) ×1 IMPLANT
EVACUATOR SILICONE 100CC (DRAIN) IMPLANT
GAUZE 4X4 16PLY ~~LOC~~+RFID DBL (SPONGE) ×1 IMPLANT
GAUZE SPONGE 4X4 12PLY STRL (GAUZE/BANDAGES/DRESSINGS) ×1 IMPLANT
GLOVE ECLIPSE 7.5 STRL STRAW (GLOVE) ×1 IMPLANT
GLOVE SS BIOGEL STRL SZ 7.5 (GLOVE) ×1 IMPLANT
GOWN STRL REUS W/ TWL LRG LVL3 (GOWN DISPOSABLE) ×2 IMPLANT
KIT BASIN OR (CUSTOM PROCEDURE TRAY) ×1 IMPLANT
KIT TURNOVER KIT B (KITS) ×1 IMPLANT
NDL HYPO 25GX1X1/2 BEV (NEEDLE) IMPLANT
NEEDLE HYPO 25GX1X1/2 BEV (NEEDLE) IMPLANT
PAD ARMBOARD POSITIONER FOAM (MISCELLANEOUS) ×2 IMPLANT
PAD MAGNETIC INSTR ST 16X20 (MISCELLANEOUS) IMPLANT
PATTIES SURGICAL .5 X3 (DISPOSABLE) IMPLANT
PENCIL FOOT CONTROL (ELECTRODE) ×1 IMPLANT
POSITIONER HEAD DONUT 9IN (MISCELLANEOUS) IMPLANT
PROTECTOR CORNEAL (OPHTHALMIC RELATED) IMPLANT
SCISSORS WIRE ANG 4 3/4 DISP (INSTRUMENTS) IMPLANT
SCREW BONE 2X7 CROSS DRIVE (Screw) IMPLANT
SOL PREP POV-IOD 4OZ 10% (MISCELLANEOUS) IMPLANT
SOLN 0.9% NACL POUR BTL 1000ML (IV SOLUTION) ×1 IMPLANT
SOLN STERILE WATER BTL 1000 ML (IV SOLUTION) ×1 IMPLANT
SPIKE FLUID TRANSFER (MISCELLANEOUS) ×1 IMPLANT
SPONGE INTESTINAL PEANUT (DISPOSABLE) IMPLANT
STAPLER SKIN PROX 35W (STAPLE) ×1 IMPLANT
SUCTION TUBE FRAZIER 8FR DISP (SUCTIONS) IMPLANT
SUT CHROMIC 3 0 PS 2 (SUTURE) ×1 IMPLANT
SUT ETHILON 3 0 PS 1 (SUTURE) IMPLANT
SUT MNCRL AB 4-0 PS2 18 (SUTURE) ×1 IMPLANT
SUT NYLON ETHILON 5-0 P-3 1X18 (SUTURE) ×1 IMPLANT
SUT SILK 2 0 PERMA HAND 18 BK (SUTURE) IMPLANT
SUT STEEL 0 18XMFL TIE 17 (SUTURE) IMPLANT
SUT STEEL 4 (SUTURE) ×1 IMPLANT
TAPE CLOTH SURG 4X10 WHT LF (GAUZE/BANDAGES/DRESSINGS) IMPLANT
TOWEL GREEN STERILE FF (TOWEL DISPOSABLE) ×1 IMPLANT
TRAY ENT MC OR (CUSTOM PROCEDURE TRAY) ×1 IMPLANT
WIRE 24 GAUGE OMINIMAX MMF (WIRE) IMPLANT
YANKAUER SUCT BULB TIP NO VENT (SUCTIONS) IMPLANT
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
## 2023-10-20 NOTE — ED Triage Notes (Signed)
 Pt has abscess to right side of face. Pt states has had recent jaw fx and is now having pus coming from tooth. Pt states flu like sx.

## 2023-10-20 NOTE — ED Notes (Signed)
 Report given to Ricardo Lozano in anesthesia, pt to transport to PACU bay 17
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
## 2023-10-21 ENCOUNTER — Other Ambulatory Visit: Payer: Self-pay

## 2023-10-21 ENCOUNTER — Inpatient Hospital Stay (HOSPITAL_COMMUNITY): Payer: MEDICAID | Admitting: Anesthesiology

## 2023-10-21 ENCOUNTER — Encounter (HOSPITAL_COMMUNITY): Payer: Self-pay | Admitting: Family Medicine

## 2023-10-21 DIAGNOSIS — S02601A Fracture of unspecified part of body of right mandible, initial encounter for closed fracture: Secondary | ICD-10-CM | POA: Diagnosis not present

## 2023-10-21 DIAGNOSIS — L0201 Cutaneous abscess of face: Secondary | ICD-10-CM | POA: Diagnosis not present

## 2023-10-21 DIAGNOSIS — S02609A Fracture of mandible, unspecified, initial encounter for closed fracture: Secondary | ICD-10-CM | POA: Diagnosis not present

## 2023-10-21 DIAGNOSIS — E876 Hypokalemia: Secondary | ICD-10-CM | POA: Diagnosis present

## 2023-10-21 LAB — CBC
HCT: 36.5 % — ABNORMAL LOW (ref 39.0–52.0)
Hemoglobin: 12.6 g/dL — ABNORMAL LOW (ref 13.0–17.0)
MCH: 30.8 pg (ref 26.0–34.0)
MCHC: 34.5 g/dL (ref 30.0–36.0)
MCV: 89.2 fL (ref 80.0–100.0)
Platelets: 271 K/uL (ref 150–400)
RBC: 4.09 MIL/uL — ABNORMAL LOW (ref 4.22–5.81)
RDW: 11.8 % (ref 11.5–15.5)
WBC: 14.3 K/uL — ABNORMAL HIGH (ref 4.0–10.5)
nRBC: 0 % (ref 0.0–0.2)

## 2023-10-21 LAB — BASIC METABOLIC PANEL WITH GFR
Anion gap: 15 (ref 5–15)
BUN: 8 mg/dL (ref 6–20)
CO2: 25 mmol/L (ref 22–32)
Calcium: 8.8 mg/dL — ABNORMAL LOW (ref 8.9–10.3)
Chloride: 96 mmol/L — ABNORMAL LOW (ref 98–111)
Creatinine, Ser: 0.77 mg/dL (ref 0.61–1.24)
GFR, Estimated: >60 mL/min (ref >60)
Glucose, Bld: 157 mg/dL — ABNORMAL HIGH (ref 70–99)
Potassium: 4 mmol/L (ref 3.5–5.1)
Sodium: 136 mmol/L (ref 135–145)

## 2023-10-21 LAB — HIV ANTIBODY (ROUTINE TESTING W REFLEX): HIV Screen 4th Generation wRfx: NONREACTIVE

## 2023-10-21 LAB — MAGNESIUM: Magnesium: 1.9 mg/dL (ref 1.7–2.4)

## 2023-10-21 MED ORDER — HYDROMORPHONE HCL 1 MG/ML IJ SOLN
0.2500 mg | INTRAMUSCULAR | Status: DC | PRN
  Administered 2023-10-21 (×4): 0.5 mg via INTRAVENOUS

## 2023-10-21 MED ORDER — MIDAZOLAM HCL (PF) 2 MG/2ML IJ SOLN
INTRAMUSCULAR | Status: DC | PRN
  Administered 2023-10-21: 2 mg via INTRAVENOUS

## 2023-10-21 MED ORDER — HYDROMORPHONE HCL 1 MG/ML IJ SOLN
INTRAMUSCULAR | Status: AC
  Filled 2023-10-21: qty 1

## 2023-10-21 MED ORDER — FENTANYL CITRATE (PF) 250 MCG/5ML IJ SOLN
INTRAMUSCULAR | Status: AC
  Filled 2023-10-21: qty 5

## 2023-10-21 MED ORDER — ACETAMINOPHEN 650 MG RE SUPP: 650.0000 mg | Freq: Four times a day (QID) | RECTAL | Status: DC | PRN

## 2023-10-21 MED ORDER — PROCHLORPERAZINE EDISYLATE 10 MG/2ML IJ SOLN: 5.0000 mg | Freq: Four times a day (QID) | INTRAMUSCULAR | Status: DC | PRN

## 2023-10-21 MED ORDER — ENSURE PLUS HIGH PROTEIN PO LIQD
237.0000 mL | Freq: Two times a day (BID) | ORAL | Status: DC
  Administered 2023-10-21 – 2023-10-28 (×13): 237 mL via ORAL

## 2023-10-21 MED ORDER — PROPOFOL 10 MG/ML IV BOLUS
INTRAVENOUS | Status: DC | PRN
  Administered 2023-10-21: 200 mg via INTRAVENOUS

## 2023-10-21 MED ORDER — LABETALOL HCL 5 MG/ML IV SOLN
5.0000 mg | INTRAVENOUS | Status: DC | PRN
  Administered 2023-10-21: 5 mg via INTRAVENOUS

## 2023-10-21 MED ORDER — HYDROMORPHONE HCL 1 MG/ML IJ SOLN
0.5000 mg | INTRAMUSCULAR | Status: DC | PRN
  Administered 2023-10-27: 0.5 mg via INTRAVENOUS
  Administered 2023-10-27: 1 mg via INTRAVENOUS
  Filled 2023-10-21 (×2): qty 1

## 2023-10-21 MED ORDER — ONDANSETRON HCL 4 MG/2ML IJ SOLN
INTRAMUSCULAR | Status: DC | PRN
  Administered 2023-10-21: 4 mg via INTRAVENOUS

## 2023-10-21 MED ORDER — ACETAMINOPHEN 325 MG PO TABS
650.0000 mg | ORAL_TABLET | Freq: Four times a day (QID) | ORAL | Status: DC | PRN
  Administered 2023-10-21 – 2023-10-24 (×5): 650 mg via ORAL
  Filled 2023-10-21 (×5): qty 2

## 2023-10-21 MED ORDER — MIDAZOLAM HCL (PF) 2 MG/2ML IJ SOLN: 0.5000 mg | Freq: Once | INTRAMUSCULAR | Status: DC | PRN

## 2023-10-21 MED ORDER — LIDOCAINE 2% (20 MG/ML) 5 ML SYRINGE
INTRAMUSCULAR | Status: DC | PRN
  Administered 2023-10-21: 20 mg via INTRAVENOUS

## 2023-10-21 MED ORDER — LIDOCAINE-EPINEPHRINE 1 %-1:100000 IJ SOLN
INTRAMUSCULAR | Status: AC
  Filled 2023-10-21: qty 1

## 2023-10-21 MED ORDER — OXYCODONE HCL 5 MG/5ML PO SOLN: 5.0000 mg | Freq: Once | ORAL | Status: DC | PRN

## 2023-10-21 MED ORDER — SODIUM CHLORIDE 0.9% FLUSH
3.0000 mL | Freq: Two times a day (BID) | INTRAVENOUS | Status: DC
  Administered 2023-10-21 – 2023-10-28 (×15): 3 mL via INTRAVENOUS

## 2023-10-21 MED ORDER — ORAL CARE MOUTH RINSE: 15.0000 mL | OROMUCOSAL | Status: DC | PRN

## 2023-10-21 MED ORDER — LIDOCAINE 2% (20 MG/ML) 5 ML SYRINGE
INTRAMUSCULAR | Status: AC
  Filled 2023-10-21: qty 5

## 2023-10-21 MED ORDER — VANCOMYCIN HCL 1250 MG/250ML IV SOLN
1250.0000 mg | Freq: Two times a day (BID) | INTRAVENOUS | Status: DC
  Administered 2023-10-21 – 2023-10-27 (×13): 1250 mg via INTRAVENOUS
  Filled 2023-10-21 (×13): qty 250

## 2023-10-21 MED ORDER — SENNOSIDES-DOCUSATE SODIUM 8.6-50 MG PO TABS: 1.0000 | ORAL_TABLET | Freq: Every evening | ORAL | Status: DC | PRN

## 2023-10-21 MED ORDER — ACETAMINOPHEN 10 MG/ML IV SOLN
INTRAVENOUS | Status: AC
  Filled 2023-10-21: qty 100

## 2023-10-21 MED ORDER — PROPOFOL 10 MG/ML IV BOLUS
INTRAVENOUS | Status: AC
  Filled 2023-10-21: qty 20

## 2023-10-21 MED ORDER — MEPERIDINE HCL 25 MG/ML IJ SOLN: 6.2500 mg | INTRAMUSCULAR | Status: DC | PRN

## 2023-10-21 MED ORDER — LACTATED RINGERS IV SOLN: INTRAVENOUS | Status: DC | PRN

## 2023-10-21 MED ORDER — SODIUM CHLORIDE 0.9 % IV SOLN
3.0000 g | Freq: Four times a day (QID) | INTRAVENOUS | Status: DC
  Administered 2023-10-21 – 2023-10-27 (×24): 3 g via INTRAVENOUS
  Filled 2023-10-21 (×24): qty 8

## 2023-10-21 MED ORDER — SUCCINYLCHOLINE CHLORIDE 200 MG/10ML IV SOSY
PREFILLED_SYRINGE | INTRAVENOUS | Status: DC | PRN
  Administered 2023-10-21: 200 mg via INTRAVENOUS

## 2023-10-21 MED ORDER — ONDANSETRON HCL 4 MG PO TABS: 4.0000 mg | ORAL_TABLET | Freq: Four times a day (QID) | ORAL | Status: DC | PRN

## 2023-10-21 MED ORDER — MIDAZOLAM HCL 2 MG/2ML IJ SOLN
INTRAMUSCULAR | Status: AC
  Filled 2023-10-21: qty 2

## 2023-10-21 MED ORDER — ONDANSETRON HCL 4 MG/2ML IJ SOLN: 4.0000 mg | Freq: Four times a day (QID) | INTRAMUSCULAR | Status: DC | PRN

## 2023-10-21 MED ORDER — FENTANYL CITRATE (PF) 250 MCG/5ML IJ SOLN
INTRAMUSCULAR | Status: DC | PRN
  Administered 2023-10-21: 100 ug via INTRAVENOUS
  Administered 2023-10-21: 50 ug via INTRAVENOUS

## 2023-10-21 MED ORDER — 0.9 % SODIUM CHLORIDE (POUR BTL) OPTIME
TOPICAL | Status: DC | PRN
  Administered 2023-10-21: 1000 mL

## 2023-10-21 MED ORDER — DEXAMETHASONE SOD PHOSPHATE PF 10 MG/ML IJ SOLN
INTRAMUSCULAR | Status: DC | PRN
  Administered 2023-10-21: 10 mg via INTRAVENOUS

## 2023-10-21 MED ORDER — HYDROMORPHONE HCL 1 MG/ML IJ SOLN: 0.5000 mg | INTRAMUSCULAR | Status: DC | PRN

## 2023-10-21 MED ORDER — ACETAMINOPHEN 10 MG/ML IV SOLN
1000.0000 mg | Freq: Once | INTRAVENOUS | Status: AC
  Administered 2023-10-21: 1000 mg via INTRAVENOUS

## 2023-10-21 MED ORDER — OXYCODONE HCL 5 MG PO TABS: 5.0000 mg | ORAL_TABLET | Freq: Once | ORAL | Status: DC | PRN

## 2023-10-21 MED ORDER — SUCCINYLCHOLINE CHLORIDE 200 MG/10ML IV SOSY
PREFILLED_SYRINGE | INTRAVENOUS | Status: AC
  Filled 2023-10-21: qty 20

## 2023-10-21 MED ORDER — KETOROLAC TROMETHAMINE 30 MG/ML IJ SOLN
30.0000 mg | Freq: Once | INTRAMUSCULAR | Status: AC
  Administered 2023-10-21: 30 mg via INTRAVENOUS

## 2023-10-21 MED ORDER — OXYCODONE HCL 5 MG PO TABS
5.0000 mg | ORAL_TABLET | ORAL | Status: DC | PRN
  Administered 2023-10-21 – 2023-10-23 (×2): 5 mg via ORAL
  Filled 2023-10-21 (×2): qty 1

## 2023-10-21 MED ORDER — OXYCODONE HCL 5 MG PO TABS: 5.0000 mg | ORAL_TABLET | ORAL | Status: DC | PRN

## 2023-10-21 MED ORDER — ACETAMINOPHEN 325 MG PO TABS: 650.0000 mg | ORAL_TABLET | Freq: Four times a day (QID) | ORAL | Status: DC | PRN

## 2023-10-21 MED FILL — Ketorolac Tromethamine Inj 30 MG/ML: INTRAMUSCULAR | Qty: 1 | Status: AC

## 2023-10-21 MED FILL — Labetalol HCl IV Soln 5 MG/ML: INTRAVENOUS | Qty: 4 | Status: AC
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
## 2023-10-21 NOTE — Anesthesia Postprocedure Evaluation (Signed)
 Anesthesia Post Note  Patient: EJAY LASHLEY  Procedure(s) Performed: IRRIGATION AND DEBRIDEMENT ABSCESS OPEN REDUCTION INTERNAL FIXATION (ORIF) MANDIBULAR FRACTURE     Patient location during evaluation: PACU Anesthesia Type: General Level of consciousness: awake and alert, oriented and patient cooperative Pain management: pain level controlled Vital Signs Assessment: post-procedure vital signs reviewed and stable Respiratory status: spontaneous breathing, nonlabored ventilation and respiratory function stable Cardiovascular status: blood pressure returned to baseline and stable Postop Assessment: no apparent nausea or vomiting Anesthetic complications: no   No notable events documented.  Last Vitals:  Vitals:   10/20/23 2300 10/20/23 2330  BP: 136/84 (!) 166/86  Pulse: 80 83  Resp:    Temp:    SpO2: 98% 100%    Last Pain:  Vitals:   10/20/23 2201  TempSrc: Oral  PainSc:                  Xerxes Agrusa,E. Lindsey Hommel

## 2023-10-21 NOTE — Plan of Care (Signed)
   Problem: Clinical Measurements: Goal: Ability to avoid or minimize complications of infection will improve Outcome: Progressing

## 2023-10-21 NOTE — ED Notes (Signed)
 Pt transported to PACU  via RN

## 2023-10-21 NOTE — Progress Notes (Signed)
 This patient waned to know if he can have some kind of nutrition? IV or oral? I told him that I will check with the MD, he got frustrated and said no matter what the doctor says my son is going to bring some kind of nutrition for him. MD made aware.

## 2023-10-21 NOTE — Transfer of Care (Signed)
 Immediate Anesthesia Transfer of Care Note  Patient: Ricardo Lozano  Procedure(s) Performed: IRRIGATION AND DEBRIDEMENT ABSCESS OPEN REDUCTION INTERNAL FIXATION (ORIF) MANDIBULAR FRACTURE  Patient Location: PACU  Anesthesia Type:General  Level of Consciousness: awake, alert , and oriented  Airway & Oxygen Therapy: Patient Spontanous Breathing  Post-op Assessment: Report given to RN, Post -op Vital signs reviewed and stable, Patient moving all extremities, and Patient able to stick tongue midline  Post vital signs: Reviewed and stable  Last Vitals:  Vitals Value Taken Time  BP 185/92 10/21/23 01:34  Temp    Pulse 75 10/21/23 01:38  Resp 17 10/21/23 01:38  SpO2 97 % 10/21/23 01:38  Vitals shown include unfiled device data.  Last Pain:  Vitals:   10/20/23 2201  TempSrc: Oral  PainSc:          Complications: No notable events documented.
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
## 2023-10-21 NOTE — Progress Notes (Signed)
 New Admission Note:  Arrival Method: Bed Mental Orientation: Alert and oriented x 4 Telemetry: N/A Assessment: Completed Skin: Warm and dry. Surgical site to RT jaw.  IV: NSL  Pain: 10/2 Tubes: N/A Safety Measures: Safety Fall Prevention Plan initiated.  Admission: Completed 5 M  Orientation: Patient has been orientated to the room, unit and the staff. Welcome booklet given.  Family: N/A  Orders have been reviewed and implemented. Will continue to monitor the patient. Call light has been placed within reach and bed alarm has been activated.   Durwood Dee BSN, RN  Phone Number: [phone]

## 2023-10-21 NOTE — Anesthesia Procedure Notes (Addendum)
 Procedure Name: Intubation Date/Time: 10/21/2023 12:59 AM  Performed by: Verner Warren BROCKS., CRNAPre-anesthesia Checklist: Patient identified, Emergency Drugs available, Suction available and Patient being monitored Patient Re-evaluated:Patient Re-evaluated prior to induction Oxygen Delivery Method: Circle system utilized Preoxygenation: Pre-oxygenation with 100% oxygen Induction Type: IV induction, Rapid sequence and Cricoid Pressure applied Laryngoscope Size: Glidescope and 4 Grade View: Grade I Tube type: Oral Tube size: 7.5 mm Number of attempts: 1 Placement Confirmation: ETT inserted through vocal cords under direct vision, positive ETCO2 and breath sounds checked- equal and bilateral Secured at: 22 cm Tube secured with: Tape Dental Injury: Teeth and Oropharynx as per pre-operative assessment

## 2023-10-21 NOTE — Progress Notes (Signed)
 Pharmacy Antibiotic Note  Ricardo Lozano is a 58 y.o. male admitted on 10/20/2023 with cellulitis/abscess.  Pharmacy has been consulted for Vancomycin  dosing. WBC mildly elevated. Renal function ok.  Plan: Vancomycin  1250 mg IV q12h >>>Estimated AUC: 502 Unasyn per MD Trend WBC, temp, renal function  F/U infectious work-up Drug levels as indicated   Height: 6' (182.9 cm) Weight: 77.1 kg (170 lb) IBW/kg (Calculated) : 77.6  Temp (24hrs), Avg:99 F (37.2 C), Min:98 F (36.7 C), Max:102.6 F (39.2 C)  Recent Labs  Lab 10/20/23 1636 10/20/23 1645  WBC 11.2*  --   CREATININE 0.88  --   LATICACIDVEN  --  0.7    Estimated Creatinine Clearance: 102.2 mL/min (by C-G formula based on SCr of 0.88 mg/dL).    No Known Allergies  Lynwood Mckusick, PharmD, BCPS Clinical Pharmacist Phone: [phone]

## 2023-10-21 NOTE — Progress Notes (Signed)
 Patient ID: Ricardo Lozano, male   DOB: 04-Apr-1965, 58 y.o.   MRN: [id number]  He is doing better. He has less pain.   Still with swelling over the right cheek but less firm and less tender.   He needs to remain on IV antibiotics through weekend. He will get fracture addressed later.

## 2023-10-22 DIAGNOSIS — L0201 Cutaneous abscess of face: Secondary | ICD-10-CM | POA: Diagnosis not present

## 2023-10-22 LAB — CBC
HCT: 37.8 % — ABNORMAL LOW (ref 39.0–52.0)
Hemoglobin: 12.7 g/dL — ABNORMAL LOW (ref 13.0–17.0)
MCH: 30.8 pg (ref 26.0–34.0)
MCHC: 33.6 g/dL (ref 30.0–36.0)
MCV: 91.5 fL (ref 80.0–100.0)
Platelets: 323 K/uL (ref 150–400)
RBC: 4.13 MIL/uL — ABNORMAL LOW (ref 4.22–5.81)
RDW: 11.9 % (ref 11.5–15.5)
WBC: 17.8 K/uL — ABNORMAL HIGH (ref 4.0–10.5)
nRBC: 0 % (ref 0.0–0.2)

## 2023-10-22 LAB — BASIC METABOLIC PANEL WITH GFR
Anion gap: 13 (ref 5–15)
BUN: 21 mg/dL — ABNORMAL HIGH (ref 6–20)
CO2: 27 mmol/L (ref 22–32)
Calcium: 8.9 mg/dL (ref 8.9–10.3)
Chloride: 101 mmol/L (ref 98–111)
Creatinine, Ser: 0.86 mg/dL (ref 0.61–1.24)
GFR, Estimated: >60 mL/min (ref >60)
Glucose, Bld: 106 mg/dL — ABNORMAL HIGH (ref 70–99)
Potassium: 3.3 mmol/L — ABNORMAL LOW (ref 3.5–5.1)
Sodium: 141 mmol/L (ref 135–145)

## 2023-10-22 MED ORDER — POTASSIUM CHLORIDE 20 MEQ PO PACK
40.0000 meq | PACK | Freq: Every day | ORAL | Status: DC
  Administered 2023-10-22 – 2023-10-23 (×2): 40 meq via ORAL
  Filled 2023-10-22 (×3): qty 2
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
## 2023-10-22 NOTE — Plan of Care (Signed)
  Problem: Clinical Measurements: Goal: Ability to avoid or minimize complications of infection will improve Outcome: Progressing   Problem: Skin Integrity: Goal: Skin integrity will improve Outcome: Progressing   Problem: Clinical Measurements: Goal: Ability to maintain clinical measurements within normal limits will improve Outcome: Progressing Goal: Will remain free from infection Outcome: Progressing Goal: Diagnostic test results will improve Outcome: Progressing Goal: Respiratory complications will improve Outcome: Progressing Goal: Cardiovascular complication will be avoided Outcome: Progressing   Problem: Activity: Goal: Risk for activity intolerance will decrease Outcome: Progressing   Problem: Nutrition: Goal: Adequate nutrition will be maintained Outcome: Progressing   Problem: Coping: Goal: Level of anxiety will decrease Outcome: Progressing   Problem: Elimination: Goal: Will not experience complications related to bowel motility Outcome: Progressing Goal: Will not experience complications related to urinary retention Outcome: Progressing   Problem: Pain Managment: Goal: General experience of comfort will improve and/or be controlled Outcome: Progressing   Problem: Safety: Goal: Ability to remain free from injury will improve Outcome: Progressing   Problem: Skin Integrity: Goal: Risk for impaired skin integrity will decrease Outcome: Progressing

## 2023-10-22 NOTE — Progress Notes (Addendum)
 ENT Progress notes: S: NAEON. He is not febrile. Feels like jaw pain is better. Continues on IV abx, tolerating PO. WBC increasing. O: Noted persistent swelling over right cheek/mandible body with tenderness. No expressible purulence on drain but fluff with purulence, which was changed. Penrose drain in place  Facial function intact. Arch bars in place.  Dentition moderately poor, mandible mild mobility but occlusion hard to assess No FOM tenderness. Mild peri-dental tenderness. Mild trismus Vitals:   10/22/23 0438 10/22/23 0752  BP: 138/83 124/69  Pulse: 66 75  Resp: 18 19  Temp: 97.7 F (36.5 C) 97.9 F (36.6 C)  SpO2: 97% 97%   Cx (10/21/2023): Noted GNR, GPC, GPR  A/P: 58 yo with mandibular fracture now with large abscess s/p drainage 10/17. Recovering postop.  - Recommend continuing IV antibiotics = agree with vanc/unasyn - Penrose drain to stay in place - Recommend no chew diet - Recommend Peridex mouthwash BID x7d - He will stay in place at least through weekend - Timing of Mandible ORIF TBD but will need to be not infected before it happens - Rec daily CBC - Remainder of care per primary team - ENT will follow  Eldora KATHEE Blanch

## 2023-10-23 DIAGNOSIS — L0201 Cutaneous abscess of face: Secondary | ICD-10-CM | POA: Diagnosis not present

## 2023-10-23 LAB — CBC
HCT: 37.2 % — ABNORMAL LOW (ref 39.0–52.0)
Hemoglobin: 12.4 g/dL — ABNORMAL LOW (ref 13.0–17.0)
MCH: 30.8 pg (ref 26.0–34.0)
MCHC: 33.3 g/dL (ref 30.0–36.0)
MCV: 92.3 fL (ref 80.0–100.0)
Platelets: 293 K/uL (ref 150–400)
RBC: 4.03 MIL/uL — ABNORMAL LOW (ref 4.22–5.81)
RDW: 11.9 % (ref 11.5–15.5)
WBC: 10 K/uL (ref 4.0–10.5)
nRBC: 0 % (ref 0.0–0.2)

## 2023-10-23 LAB — BASIC METABOLIC PANEL WITH GFR
Anion gap: 11 (ref 5–15)
BUN: 13 mg/dL (ref 6–20)
CO2: 29 mmol/L (ref 22–32)
Calcium: 8.9 mg/dL (ref 8.9–10.3)
Chloride: 101 mmol/L (ref 98–111)
Creatinine, Ser: 0.7 mg/dL (ref 0.61–1.24)
GFR, Estimated: >60 mL/min (ref >60)
Glucose, Bld: 92 mg/dL (ref 70–99)
Potassium: 3.6 mmol/L (ref 3.5–5.1)
Sodium: 141 mmol/L (ref 135–145)

## 2023-10-23 LAB — GLUCOSE, CAPILLARY: Glucose-Capillary: 208 mg/dL — ABNORMAL HIGH (ref 70–99)

## 2023-10-23 NOTE — Plan of Care (Signed)
  Problem: Clinical Measurements: Goal: Ability to avoid or minimize complications of infection will improve Outcome: Progressing   Problem: Skin Integrity: Goal: Skin integrity will improve Outcome: Progressing   Problem: Clinical Measurements: Goal: Ability to maintain clinical measurements within normal limits will improve Outcome: Progressing Goal: Will remain free from infection Outcome: Progressing Goal: Diagnostic test results will improve Outcome: Progressing Goal: Respiratory complications will improve Outcome: Progressing Goal: Cardiovascular complication will be avoided Outcome: Progressing   Problem: Activity: Goal: Risk for activity intolerance will decrease Outcome: Progressing   Problem: Nutrition: Goal: Adequate nutrition will be maintained Outcome: Progressing   Problem: Coping: Goal: Level of anxiety will decrease Outcome: Progressing   Problem: Elimination: Goal: Will not experience complications related to bowel motility Outcome: Progressing Goal: Will not experience complications related to urinary retention Outcome: Progressing   Problem: Pain Managment: Goal: General experience of comfort will improve and/or be controlled Outcome: Progressing   Problem: Safety: Goal: Ability to remain free from injury will improve Outcome: Progressing   Problem: Skin Integrity: Goal: Risk for impaired skin integrity will decrease Outcome: Progressing
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
## 2023-10-23 NOTE — Plan of Care (Signed)
   Problem: Skin Integrity: Goal: Skin integrity will improve Outcome: Progressing

## 2023-10-24 DIAGNOSIS — L0201 Cutaneous abscess of face: Secondary | ICD-10-CM | POA: Diagnosis not present

## 2023-10-24 LAB — CBC
HCT: 37.7 % — ABNORMAL LOW (ref 39.0–52.0)
Hemoglobin: 12.8 g/dL — ABNORMAL LOW (ref 13.0–17.0)
MCH: 30.8 pg (ref 26.0–34.0)
MCHC: 34 g/dL (ref 30.0–36.0)
MCV: 90.8 fL (ref 80.0–100.0)
Platelets: 330 K/uL (ref 150–400)
RBC: 4.15 MIL/uL — ABNORMAL LOW (ref 4.22–5.81)
RDW: 11.8 % (ref 11.5–15.5)
WBC: 8.9 K/uL (ref 4.0–10.5)
nRBC: 0 % (ref 0.0–0.2)

## 2023-10-24 LAB — BASIC METABOLIC PANEL WITH GFR
Anion gap: 10 (ref 5–15)
BUN: 15 mg/dL (ref 6–20)
CO2: 29 mmol/L (ref 22–32)
Calcium: 9.2 mg/dL (ref 8.9–10.3)
Chloride: 101 mmol/L (ref 98–111)
Creatinine, Ser: 0.71 mg/dL (ref 0.61–1.24)
GFR, Estimated: >60 mL/min (ref >60)
Glucose, Bld: 107 mg/dL — ABNORMAL HIGH (ref 70–99)
Potassium: 3.9 mmol/L (ref 3.5–5.1)
Sodium: 140 mmol/L (ref 135–145)

## 2023-10-24 LAB — AEROBIC/ANAEROBIC CULTURE W GRAM STAIN (SURGICAL/DEEP WOUND): Culture: NORMAL

## 2023-10-24 MED ORDER — POTASSIUM CHLORIDE CRYS ER 20 MEQ PO TBCR
40.0000 meq | EXTENDED_RELEASE_TABLET | Freq: Every day | ORAL | Status: DC
  Administered 2023-10-24 – 2023-10-25 (×2): 40 meq via ORAL
  Filled 2023-10-24 (×4): qty 2

## 2023-10-24 MED ORDER — POTASSIUM CHLORIDE CRYS ER 20 MEQ PO TBCR: 40.0000 meq | EXTENDED_RELEASE_TABLET | Freq: Every day | ORAL | Status: DC
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
## 2023-10-24 NOTE — Progress Notes (Signed)
 Patient ID: Ricardo Lozano, male   DOB: Aug 10, 1965, 58 y.o.   MRN: [id number]  He is much better.  His swelling of the right side is decreased substantially.  He is not having significant drainage from the drain any longer.  He still significantly tender.  I discussed this case with oral surgery and they feel like both teeth need to be removed and we will decide about a plate and MMF at the time of surgery.  He will continue IV antibiotics and this will be put on the schedule for Wednesday afternoon.

## 2023-10-24 NOTE — Plan of Care (Signed)
  Problem: Clinical Measurements: Goal: Ability to avoid or minimize complications of infection will improve Outcome: Progressing   Problem: Skin Integrity: Goal: Skin integrity will improve Outcome: Progressing   Problem: Clinical Measurements: Goal: Ability to maintain clinical measurements within normal limits will improve Outcome: Progressing Goal: Will remain free from infection Outcome: Progressing Goal: Diagnostic test results will improve Outcome: Progressing Goal: Respiratory complications will improve Outcome: Progressing Goal: Cardiovascular complication will be avoided Outcome: Progressing   Problem: Activity: Goal: Risk for activity intolerance will decrease Outcome: Progressing   Problem: Nutrition: Goal: Adequate nutrition will be maintained Outcome: Progressing   Problem: Coping: Goal: Level of anxiety will decrease Outcome: Progressing   Problem: Elimination: Goal: Will not experience complications related to bowel motility Outcome: Progressing Goal: Will not experience complications related to urinary retention Outcome: Progressing   Problem: Pain Managment: Goal: General experience of comfort will improve and/or be controlled Outcome: Progressing   Problem: Safety: Goal: Ability to remain free from injury will improve Outcome: Progressing   Problem: Skin Integrity: Goal: Risk for impaired skin integrity will decrease Outcome: Progressing

## 2023-10-24 NOTE — Plan of Care (Signed)
  Problem: Clinical Measurements: Goal: Ability to avoid or minimize complications of infection will improve 10/24/2023 1717 by Shela Lapine, RN Outcome: Progressing 10/24/2023 1640 by Shela Lapine, RN Outcome: Progressing   Problem: Skin Integrity: Goal: Skin integrity will improve 10/24/2023 1717 by Shela Lapine, RN Outcome: Progressing 10/24/2023 1640 by Shela Lapine, RN Outcome: Progressing   Problem: Clinical Measurements: Goal: Ability to maintain clinical measurements within normal limits will improve 10/24/2023 1717 by Shela Lapine, RN Outcome: Progressing 10/24/2023 1640 by Shela Lapine, RN Outcome: Progressing Goal: Will remain free from infection 10/24/2023 1717 by Shela Lapine, RN Outcome: Progressing 10/24/2023 1640 by Shela Lapine, RN Outcome: Progressing Goal: Diagnostic test results will improve 10/24/2023 1717 by Shela Lapine, RN Outcome: Progressing 10/24/2023 1640 by Shela Lapine, RN Outcome: Progressing Goal: Respiratory complications will improve 10/24/2023 1717 by Shela Lapine, RN Outcome: Progressing 10/24/2023 1640 by Shela Lapine, RN Outcome: Progressing Goal: Cardiovascular complication will be avoided 10/24/2023 1717 by Shela Lapine, RN Outcome: Progressing 10/24/2023 1640 by Shela Lapine, RN Outcome: Progressing   Problem: Activity: Goal: Risk for activity intolerance will decrease 10/24/2023 1717 by Shela Lapine, RN Outcome: Progressing 10/24/2023 1640 by Shela Lapine, RN Outcome: Progressing   Problem: Nutrition: Goal: Adequate nutrition will be maintained 10/24/2023 1717 by Shela Lapine, RN Outcome: Progressing 10/24/2023 1640 by Shela Lapine, RN Outcome: Progressing   Problem: Coping: Goal: Level of anxiety will decrease 10/24/2023 1717 by Shela Lapine, RN Outcome: Progressing 10/24/2023 1640 by Shela Lapine, RN Outcome: Progressing   Problem: Elimination: Goal: Will not experience  complications related to bowel motility 10/24/2023 1717 by Shela Lapine, RN Outcome: Progressing 10/24/2023 1640 by Shela Lapine, RN Outcome: Progressing Goal: Will not experience complications related to urinary retention 10/24/2023 1717 by Shela Lapine, RN Outcome: Progressing 10/24/2023 1640 by Shela Lapine, RN Outcome: Progressing   Problem: Pain Managment: Goal: General experience of comfort will improve and/or be controlled 10/24/2023 1717 by Shela Lapine, RN Outcome: Progressing 10/24/2023 1640 by Shela Lapine, RN Outcome: Progressing   Problem: Safety: Goal: Ability to remain free from injury will improve 10/24/2023 1717 by Shela Lapine, RN Outcome: Progressing 10/24/2023 1640 by Shela Lapine, RN Outcome: Progressing   Problem: Skin Integrity: Goal: Risk for impaired skin integrity will decrease 10/24/2023 1717 by Shela Lapine, RN Outcome: Progressing 10/24/2023 1640 by Shela Lapine, RN Outcome: Progressing

## 2023-10-25 ENCOUNTER — Encounter (HOSPITAL_COMMUNITY): Payer: Self-pay | Admitting: Otolaryngology

## 2023-10-25 DIAGNOSIS — L0201 Cutaneous abscess of face: Secondary | ICD-10-CM | POA: Diagnosis not present

## 2023-10-25 NOTE — Progress Notes (Signed)
 Patient ID: Ricardo Lozano, male   DOB: 23-Jan-1965, 58 y.o.   MRN: [id number]  He is much better even from yesterday. Decreased pain and swelling. I have discussed case with Dr Helga and he will remove the molars. Will decide at the time of surgery whether to plate the fracture or do MMF. Depends on the infection appearance. We discussed risks, benefits and options all questions answered and consent obtained for ORIF mandible and tooth extraction.

## 2023-10-25 NOTE — H&P (View-Only) (Signed)
 Patient ID: Ricardo Lozano, male   DOB: 23-Jan-1965, 58 y.o.   MRN: [id number]  He is much better even from yesterday. Decreased pain and swelling. I have discussed case with Dr Helga and he will remove the molars. Will decide at the time of surgery whether to plate the fracture or do MMF. Depends on the infection appearance. We discussed risks, benefits and options all questions answered and consent obtained for ORIF mandible and tooth extraction.
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
## 2023-10-26 ENCOUNTER — Encounter (HOSPITAL_COMMUNITY): Payer: Self-pay | Admitting: Family Medicine

## 2023-10-26 ENCOUNTER — Inpatient Hospital Stay (HOSPITAL_COMMUNITY): Payer: MEDICAID | Admitting: Anesthesiology

## 2023-10-26 ENCOUNTER — Encounter (HOSPITAL_COMMUNITY): Admission: EM | Disposition: A | Payer: Self-pay | Source: Home / Self Care | Attending: Internal Medicine

## 2023-10-26 ENCOUNTER — Other Ambulatory Visit: Payer: Self-pay

## 2023-10-26 DIAGNOSIS — S02651A Fracture of angle of right mandible, initial encounter for closed fracture: Principal | ICD-10-CM

## 2023-10-26 DIAGNOSIS — S02609A Fracture of mandible, unspecified, initial encounter for closed fracture: Secondary | ICD-10-CM

## 2023-10-26 DIAGNOSIS — L0201 Cutaneous abscess of face: Secondary | ICD-10-CM | POA: Diagnosis not present

## 2023-10-26 HISTORY — PX: ORIF MANDIBULAR FRACTURE: SHX2127

## 2023-10-26 HISTORY — PX: TOOTH EXTRACTION: SHX859

## 2023-10-26 SURGERY — OPEN REDUCTION INTERNAL FIXATION (ORIF) MANDIBULAR FRACTURE
Anesthesia: General | Site: Mouth | Laterality: Right

## 2023-10-26 MED ORDER — SODIUM CHLORIDE 0.9 % IR SOLN
Status: DC | PRN
  Administered 2023-10-26: 1000 mL

## 2023-10-26 MED ORDER — LIDOCAINE-EPINEPHRINE 2 %-1:100000 IJ SOLN
INTRAMUSCULAR | Status: AC
Start: 2023-10-26 — End: 2023-10-26
  Filled 2023-10-26: qty 1

## 2023-10-26 MED ORDER — ROCURONIUM BROMIDE 10 MG/ML (PF) SYRINGE
PREFILLED_SYRINGE | INTRAVENOUS | Status: AC
Start: 2023-10-26 — End: 2023-10-26
  Filled 2023-10-26: qty 10

## 2023-10-26 MED ORDER — ACETAMINOPHEN 10 MG/ML IV SOLN: 1000.0000 mg | Freq: Once | INTRAVENOUS | Status: DC | PRN

## 2023-10-26 MED ORDER — ACETAMINOPHEN 10 MG/ML IV SOLN
INTRAVENOUS | Status: DC | PRN
  Administered 2023-10-26: 1000 mg via INTRAVENOUS

## 2023-10-26 MED ORDER — DROPERIDOL 2.5 MG/ML IJ SOLN: 0.6250 mg | Freq: Once | INTRAMUSCULAR | Status: DC | PRN

## 2023-10-26 MED ORDER — PHENYLEPHRINE 80 MCG/ML (10ML) SYRINGE FOR IV PUSH (FOR BLOOD PRESSURE SUPPORT)
PREFILLED_SYRINGE | INTRAVENOUS | Status: AC
Start: 2023-10-26 — End: 2023-10-26
  Filled 2023-10-26: qty 10

## 2023-10-26 MED ORDER — DEXAMETHASONE SOD PHOSPHATE PF 10 MG/ML IJ SOLN
INTRAMUSCULAR | Status: DC | PRN
Start: 2023-10-26 — End: 2023-10-26
  Administered 2023-10-26: 10 mg via INTRAVENOUS

## 2023-10-26 MED ORDER — LIDOCAINE-EPINEPHRINE 1 %-1:100000 IJ SOLN
INTRAMUSCULAR | Status: AC
  Filled 2023-10-26: qty 1

## 2023-10-26 MED ORDER — OXYMETAZOLINE HCL 0.05 % NA SOLN
NASAL | Status: DC | PRN
Start: 2023-10-26 — End: 2023-10-26
  Administered 2023-10-26: 2 via NASAL

## 2023-10-26 MED ORDER — OXYMETAZOLINE HCL 0.05 % NA SOLN
NASAL | Status: AC
  Filled 2023-10-26: qty 30

## 2023-10-26 MED ORDER — SUGAMMADEX SODIUM 200 MG/2ML IV SOLN
INTRAVENOUS | Status: DC | PRN
  Administered 2023-10-26: 200 mg via INTRAVENOUS

## 2023-10-26 MED ORDER — FENTANYL CITRATE (PF) 250 MCG/5ML IJ SOLN
INTRAMUSCULAR | Status: DC | PRN
  Administered 2023-10-26: 25 ug via INTRAVENOUS
  Administered 2023-10-26: 50 ug via INTRAVENOUS
  Administered 2023-10-26: 25 ug via INTRAVENOUS
  Administered 2023-10-26 (×2): 75 ug via INTRAVENOUS

## 2023-10-26 MED ORDER — FENTANYL CITRATE (PF) 100 MCG/2ML IJ SOLN
INTRAMUSCULAR | Status: AC
  Filled 2023-10-26: qty 2

## 2023-10-26 MED ORDER — FENTANYL CITRATE (PF) 250 MCG/5ML IJ SOLN
INTRAMUSCULAR | Status: AC
  Filled 2023-10-26: qty 5

## 2023-10-26 MED ORDER — OXYCODONE HCL 5 MG/5ML PO SOLN: 5.0000 mg | Freq: Once | ORAL | Status: DC | PRN

## 2023-10-26 MED ORDER — FENTANYL CITRATE (PF) 100 MCG/2ML IJ SOLN
25.0000 ug | INTRAMUSCULAR | Status: DC | PRN
  Administered 2023-10-26: 50 ug via INTRAVENOUS

## 2023-10-26 MED ORDER — CHLORHEXIDINE GLUCONATE 0.12 % MT SOLN
OROMUCOSAL | Status: AC
  Administered 2023-10-26: 15 mL via OROMUCOSAL
  Filled 2023-10-26: qty 15

## 2023-10-26 MED ORDER — LACTATED RINGERS IV SOLN
INTRAVENOUS | Status: DC
Start: 2023-10-26 — End: 2023-10-26

## 2023-10-26 MED ORDER — MIDAZOLAM HCL (PF) 2 MG/2ML IJ SOLN
INTRAMUSCULAR | Status: DC | PRN
  Administered 2023-10-26: 2 mg via INTRAVENOUS

## 2023-10-26 MED ORDER — ONDANSETRON HCL 4 MG/2ML IJ SOLN
INTRAMUSCULAR | Status: AC
Start: 2023-10-26 — End: 2023-10-26
  Filled 2023-10-26: qty 2

## 2023-10-26 MED ORDER — LIDOCAINE 2% (20 MG/ML) 5 ML SYRINGE
INTRAMUSCULAR | Status: DC | PRN
  Administered 2023-10-26: 40 mg via INTRAVENOUS

## 2023-10-26 MED ORDER — ONDANSETRON HCL 4 MG/2ML IJ SOLN
INTRAMUSCULAR | Status: DC | PRN
  Administered 2023-10-26: 4 mg via INTRAVENOUS

## 2023-10-26 MED ORDER — MIDAZOLAM HCL 2 MG/2ML IJ SOLN
INTRAMUSCULAR | Status: AC
  Filled 2023-10-26: qty 2

## 2023-10-26 MED ORDER — EPINEPHRINE HCL (NASAL) 0.1 % NA SOLN
NASAL | Status: AC
  Filled 2023-10-26: qty 30

## 2023-10-26 MED ORDER — ROCURONIUM BROMIDE 10 MG/ML (PF) SYRINGE
PREFILLED_SYRINGE | INTRAVENOUS | Status: DC | PRN
  Administered 2023-10-26: 50 mg via INTRAVENOUS

## 2023-10-26 MED ORDER — PROPOFOL 10 MG/ML IV BOLUS
INTRAVENOUS | Status: DC | PRN
  Administered 2023-10-26: 140 mg via INTRAVENOUS

## 2023-10-26 MED ORDER — LIDOCAINE-EPINEPHRINE (PF) 2 %-1:200000 IJ SOLN
INTRAMUSCULAR | Status: DC | PRN
  Administered 2023-10-26: 4 mL via INTRADERMAL

## 2023-10-26 MED ORDER — LIDOCAINE 2% (20 MG/ML) 5 ML SYRINGE
INTRAMUSCULAR | Status: AC
  Filled 2023-10-26: qty 5

## 2023-10-26 MED ORDER — CHLORHEXIDINE GLUCONATE 0.12 % MT SOLN: 15.0000 mL | Freq: Once | OROMUCOSAL | Status: AC

## 2023-10-26 MED ORDER — PROPOFOL 10 MG/ML IV BOLUS
INTRAVENOUS | Status: AC
  Filled 2023-10-26: qty 20

## 2023-10-26 MED ORDER — OXYCODONE HCL 5 MG PO TABS: 5.0000 mg | ORAL_TABLET | Freq: Once | ORAL | Status: DC | PRN

## 2023-10-26 MED ORDER — ORAL CARE MOUTH RINSE: 15.0000 mL | Freq: Once | OROMUCOSAL | Status: AC

## 2023-10-26 SURGICAL SUPPLY — 50 items
BIT DRILL 1.5X50 7MMSTP W/NTCH (BIT) IMPLANT
BIT DRILL 1.6X115MM (BIT) IMPLANT
BIT DRILL DEPTH MARK1.8X115 26 (MISCELLANEOUS) IMPLANT
BLADE SURG 10 STRL SS (BLADE) IMPLANT
BUR CROSS CUT FISSURE 1.6 (BURR) ×2 IMPLANT
CANISTER SUCTION 3000ML PPV (SUCTIONS) ×4 IMPLANT
CLEANER TIP ELECTROSURG 2X2 (MISCELLANEOUS) ×2 IMPLANT
COVER SURGICAL LIGHT HANDLE (MISCELLANEOUS) ×6 IMPLANT
ELECTRODE REM PT RTRN 9FT ADLT (ELECTROSURGICAL) ×2 IMPLANT
GAUZE 4X4 16PLY ~~LOC~~+RFID DBL (SPONGE) IMPLANT
GAUZE PACKING FOLDED 2 STR (GAUZE/BANDAGES/DRESSINGS) ×2 IMPLANT
GLOVE BIO SURGEON STRL SZ8 (GLOVE) ×2 IMPLANT
GLOVE ECLIPSE 7.5 STRL STRAW (GLOVE) ×2 IMPLANT
GOWN STRL REUS W/ TWL LRG LVL3 (GOWN DISPOSABLE) ×4 IMPLANT
GOWN STRL REUS W/ TWL XL LVL3 (GOWN DISPOSABLE) ×2 IMPLANT
KIT BASIN OR (CUSTOM PROCEDURE TRAY) ×4 IMPLANT
KIT TURNOVER KIT B (KITS) ×4 IMPLANT
NDL 22X1.5 STRL (OR ONLY) (MISCELLANEOUS) ×2 IMPLANT
NDL HYPO 25GX1X1/2 BEV (NEEDLE) IMPLANT
NEEDLE 22X1.5 STRL (OR ONLY) (MISCELLANEOUS) ×2 IMPLANT
NEEDLE HYPO 25GX1X1/2 BEV (NEEDLE) ×2 IMPLANT
PAD ARMBOARD POSITIONER FOAM (MISCELLANEOUS) ×8 IMPLANT
PENCIL FOOT CONTROL (ELECTRODE) ×2 IMPLANT
PLATE 4HOLE STR 1.6MM (Plate) IMPLANT
PLATE LOCK STRT 4H 1/SCREW (Plate) IMPLANT
POSITIONER HEAD DONUT 9IN (MISCELLANEOUS) IMPLANT
SCISSORS WIRE ANG 4 3/4 DISP (INSTRUMENTS) IMPLANT
SCREW NON LOCK X-DR 2.0X5 (Screw) IMPLANT
SCREW NON LOCK X-DR 2.3X12 (Screw) IMPLANT
SCREW NON LOCK X-DR 2.3X14 (Screw) IMPLANT
SCREW NON LOCK X-DR 2.3X6 (Screw) IMPLANT
SLEEVE IRRIGATION ELITE 7 (MISCELLANEOUS) IMPLANT
SOLN 0.9% NACL POUR BTL 1000ML (IV SOLUTION) ×4 IMPLANT
SOLN STERILE WATER BTL 1000 ML (IV SOLUTION) ×2 IMPLANT
SPIKE FLUID TRANSFER (MISCELLANEOUS) ×2 IMPLANT
SUT CHROMIC 3 0 PS 2 (SUTURE) ×4 IMPLANT
SUT CHROMIC 3 0 SH 27 (SUTURE) IMPLANT
SUT CHROMIC 4 0 P 3 18 (SUTURE) IMPLANT
SUT MNCRL AB 4-0 PS2 18 (SUTURE) ×2 IMPLANT
SUT NYLON ETHILON 5-0 P-3 1X18 (SUTURE) ×2 IMPLANT
SUT SILK 2 0 PERMA HAND 18 BK (SUTURE) IMPLANT
SUT STEEL 0 18XMFL TIE 17 (SUTURE) IMPLANT
SUT STEEL 4 (SUTURE) ×2 IMPLANT
SUT VIC AB 3-0 PS2 18XBRD (SUTURE) ×4 IMPLANT
SYR CONTROL 10ML LL (SYRINGE) ×2 IMPLANT
TOWEL GREEN STERILE (TOWEL DISPOSABLE) ×2 IMPLANT
TOWEL GREEN STERILE FF (TOWEL DISPOSABLE) ×2 IMPLANT
TRAY ENT MC OR (CUSTOM PROCEDURE TRAY) ×4 IMPLANT
TUBING IRRIGATION (MISCELLANEOUS) ×2 IMPLANT
YANKAUER SUCT BULB TIP NO VENT (SUCTIONS) ×2 IMPLANT

## 2023-10-26 NOTE — Plan of Care (Signed)
  Problem: Clinical Measurements: Goal: Ability to avoid or minimize complications of infection will improve Outcome: Progressing   Problem: Skin Integrity: Goal: Skin integrity will improve Outcome: Progressing   Problem: Clinical Measurements: Goal: Ability to maintain clinical measurements within normal limits will improve Outcome: Progressing Goal: Will remain free from infection Outcome: Progressing Goal: Diagnostic test results will improve Outcome: Progressing Goal: Respiratory complications will improve Outcome: Progressing Goal: Cardiovascular complication will be avoided Outcome: Progressing   Problem: Activity: Goal: Risk for activity intolerance will decrease Outcome: Progressing   Problem: Nutrition: Goal: Adequate nutrition will be maintained Outcome: Progressing   Problem: Coping: Goal: Level of anxiety will decrease Outcome: Progressing   Problem: Elimination: Goal: Will not experience complications related to bowel motility Outcome: Progressing Goal: Will not experience complications related to urinary retention Outcome: Progressing   Problem: Pain Managment: Goal: General experience of comfort will improve and/or be controlled Outcome: Progressing   Problem: Safety: Goal: Ability to remain free from injury will improve Outcome: Progressing   Problem: Skin Integrity: Goal: Risk for impaired skin integrity will decrease Outcome: Progressing
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
## 2023-10-26 NOTE — TOC Progression Note (Signed)
 Transition of Care [facility]) - Progression Note    Patient Details  Name: Ricardo Lozano MRN: [id number] Date of Birth: July 07, 1965  [facility] CM/SW Contact  Tom-Johnson, [place] Daphne, RN Phone Number: 10/26/2023, 1:30 PM  Clinical Narrative:     Patient continues on IV abx. ENT consulted, plan to OR today 10/26/23 for ORIF Mandible and Tooth Extraction.   Patient not Medically ready for discharge.  CM will continue to follow as patient progresses with care towards discharge.                        Expected Discharge Plan and Services                                               Social Drivers of Health (SDOH) Interventions SDOH Screenings   Transportation Needs: Not on File (08/10/2022)   Received from [employer] Strain: Not on File (08/10/2022)   Received from [facility]  Physical Activity: Not on File (08/10/2022)   Received from [facility]  Social Connections: Not on File (08/10/2022)   Received from [facility]  Stress: Not on File (08/10/2022)   Received from [facility]  Tobacco Use: Low Risk  (10/21/2023)    Readmission Risk Interventions    10/21/2023   12:56 PM  Readmission Risk Prevention Plan  Post Dischage Appt Complete  Medication Screening Complete  Transportation Screening Complete

## 2023-10-26 NOTE — Interval H&P Note (Signed)
 History and Physical Interval Note:  10/26/2023 2:55 PM  Ricardo Lozano  has presented today for surgery, with the diagnosis of MANDIBULAR FRACTURE.  The various methods of treatment have been discussed with the patient and family. After consideration of risks, benefits and other options for treatment, the patient has consented to  Procedure(s): OPEN REDUCTION INTERNAL FIXATION (ORIF) MANDIBULAR FRACTURE (N/A) EXTRACTION, TOOTH, MOLAR (N/A) as a surgical intervention.  The patient's history has been reviewed, patient examined, no change in status, stable for surgery.  I have reviewed the patient's chart and labs.  Questions were answered to the patient's satisfaction.     Norleen Notice

## 2023-10-26 NOTE — Anesthesia Preprocedure Evaluation (Signed)
 Anesthesia Evaluation  Patient identified by MRN, date of birth, ID band Patient awake    Reviewed: Allergy & Precautions, NPO status , Patient's Chart, lab work & pertinent test results  Airway Mallampati: Unable to assess  TM Distance: >3 FB Neck ROM: Full    Dental  (+) Teeth Intact, Dental Advisory Given, Poor Dentition   Pulmonary neg pulmonary ROS   breath sounds clear to auscultation       Cardiovascular negative cardio ROS  Rhythm:Regular Rate:Normal     Neuro/Psych negative neurological ROS  negative psych ROS   GI/Hepatic negative GI ROS, Neg liver ROS,,,  Endo/Other  negative endocrine ROS    Renal/GU negative Renal ROS     Musculoskeletal negative musculoskeletal ROS (+)    Abdominal   Peds  Hematology negative hematology ROS (+)   Anesthesia Other Findings   Reproductive/Obstetrics                              Anesthesia Physical Anesthesia Plan  ASA: 2  Anesthesia Plan: General   Post-op Pain Management: Ofirmev  IV (intra-op)*   Induction: Intravenous  PONV Risk Score and Plan: 3 and Ondansetron , Midazolam  and Dexamethasone   Airway Management Planned: Nasal ETT  Additional Equipment: None  Intra-op Plan:   Post-operative Plan: Extubation in OR  Informed Consent:   Plan Discussed with: CRNA  Anesthesia Plan Comments:         Anesthesia Quick Evaluation

## 2023-10-26 NOTE — Anesthesia Procedure Notes (Signed)
 Procedure Name: Intubation Date/Time: 10/26/2023 3:08 PM  Performed by: Julien Manus, CRNAPre-anesthesia Checklist: Patient identified, Emergency Drugs available, Suction available and Patient being monitored Patient Re-evaluated:Patient Re-evaluated prior to induction Oxygen Delivery Method: Circle System Utilized Preoxygenation: Pre-oxygenation with 100% oxygen Induction Type: IV induction Ventilation: Mask ventilation without difficulty Laryngoscope Size: Glidescope and 4 Grade View: Grade I Nasal Tubes: Nasal Rae, Nasal prep performed and Right Tube size: 7.0 mm Number of attempts: 1 Placement Confirmation: ETT inserted through vocal cords under direct vision, positive ETCO2 and breath sounds checked- equal and bilateral Secured at: 30 cm Tube secured with: Tape Dental Injury: Teeth and Oropharynx as per pre-operative assessment

## 2023-10-26 NOTE — Op Note (Signed)
   Pre-Op/postop diagnosis: Right angle mandible fracture Procedure: Open reduction internal fixation of right mandible fracture Anesthesia: General Surgeon Dr. Roark, Dr. Helga Estimated blood loss: Approximately 50 cc Indications: A 58 year old who had a mandible fracture that was not treated initially and he came in with a very large abscess of the right mandibular region.  That was drained last week and he was placed on intravenous antibiotics.  He has been settling down through the weekend and now he is ready for proceeding with a open duction internal fixation with removal of the wisdom tooth.  He was informed the risk and benefits of the procedure and options were discussed all questions were answered and consent was obtained.  His occlusion is a significant underbite.I was consulted to assist in removing the impacted wisdom tooth in the line of the fracture.      Procedure: Once the patient was successfully induced, prepped and draped as discussed in Dr. Roark' operative note, attention was turned intraorally where a total of 5cc 1%Lidocaine with 1:100,000 epi was injected locally and via an inferior alveolar nerve block. #15 blade was used to make a sulcular incision around tooth #31 with vertical and distobuccal releasing incisions. Full thickness mucoperiosteal flap was then elevated exposing the ramus, extent of the fracture line, as well as the impacted tooth #32. Fissure bur was used to create a buccal trough and remove overlying bone, and the tooth was sectioned and removed in pieces without complications. The remainder of the procedure was dictated by Dr. Roark.

## 2023-10-26 NOTE — Transfer of Care (Signed)
 Immediate Anesthesia Transfer of Care Note  Patient: Ricardo Lozano  Procedure(s) Performed: OPEN REDUCTION INTERNAL FIXATION (ORIF) MANDIBULAR FRACTURE (Mouth) EXTRACTION, TOOTH, THIRD MOLAR (Right: Mouth)  Patient Location: PACU  Anesthesia Type:General  Level of Consciousness: awake and alert   Airway & Oxygen Therapy: Patient Spontanous Breathing and Patient connected to face mask oxygen  Post-op Assessment: Report given to RN and Post -op Vital signs reviewed and stable  Post vital signs: Reviewed and stable  Last Vitals:  Vitals Value Taken Time  BP 185/97 10/26/23 16:30  Temp    Pulse 61 10/26/23 16:31  Resp 12 10/26/23 16:31  SpO2 100 % 10/26/23 16:31  Vitals shown include unfiled device data.  Last Pain:  Vitals:   10/26/23 1335  TempSrc: Oral  PainSc: 2       Patients Stated Pain Goal: 0 (10/26/23 1249)  Complications: No notable events documented.

## 2023-10-26 NOTE — Op Note (Addendum)
 Pre-Op/postop diagnosis: Right angle mandible fracture Procedure: Open reduction internal fixation of right mandible fracture Anesthesia: General Surgeon Dr. Roark, Dr. Helga Estimated blood loss: Approximately 50 cc Indications: A 58 year old who had a mandible fracture that was not treated initially and he came in with a very large abscess of the right mandibular region.  That was drained last week and he was placed on intravenous antibiotics.  He has been settling down through the weekend and now he is ready for proceeding with a open duction internal fixation with removal of the wisdom tooth.  He was informed the risk and benefits of the procedure and options were discussed all questions were answered and consent was obtained.  His occlusion is a significant underbite.  Procedure: Patient was taken the op room placed in supine position after general nasal endotracheal tube anesthesia was placed in the supine position prepped and draped in the usual sterile manner.  The drain was removed from the right neck.  The procedure was started by Dr. Helga and an incision was made along the gingivolabial sulcus and the wisdom tooth was removed to be dictated in a separate op report.  Once the tooth was removed the mandible fracture was easily visualized and extremely mobile.  A dissection bluntly was made to connect the neck incision that had previously been made into the gingivolabial sulcus region.  The angulation of this was not appropriate so a separate stab incision was made of the skin and the blunt dissection was performed into the same location.  The trocar was then placed through this wound and the plate was fashioned along the inferior edge.  This was a 4-hole plate and was drilled and #12 and 14 screws were placed.  This secured the inferior but he still was moving superiorly so a banding plate was placed monocortical he with #5 screws.  This secured the fracture nicely.  The wound was irrigated  out.  It was closed with interrupted 3-0 chromic.  The patient was then placed into his occlusion which was a significant overbite and secured with 2 wires.  The wounds in the neck were left open for continued drainage from his previous infection.  He was awakened brought to recovery in stable condition counts correct

## 2023-10-27 DIAGNOSIS — L0201 Cutaneous abscess of face: Secondary | ICD-10-CM | POA: Diagnosis not present

## 2023-10-27 MED ORDER — AMOXICILLIN-POT CLAVULANATE 400-57 MG/5ML PO SUSR
800.0000 mg | Freq: Two times a day (BID) | ORAL | Status: DC
Start: 2023-10-27 — End: 2023-11-10
  Administered 2023-10-27 – 2023-10-28 (×3): 800 mg via ORAL
  Filled 2023-10-27 (×4): qty 10

## 2023-10-27 MED ORDER — OXYCODONE HCL 5 MG PO TABS: 5.0000 mg | ORAL_TABLET | ORAL | Status: DC | PRN

## 2023-10-27 MED ORDER — ACETAMINOPHEN 650 MG RE SUPP: 650.0000 mg | Freq: Four times a day (QID) | RECTAL | Status: DC | PRN

## 2023-10-27 MED ORDER — POTASSIUM CHLORIDE 20 MEQ PO PACK
40.0000 meq | PACK | Freq: Every day | ORAL | Status: DC
  Administered 2023-10-27 – 2023-10-28 (×2): 40 meq via ORAL
  Filled 2023-10-27 (×2): qty 2

## 2023-10-27 MED ORDER — ACETAMINOPHEN 500 MG PO TABS: 1000.0000 mg | ORAL_TABLET | Freq: Four times a day (QID) | ORAL | Status: DC | PRN

## 2023-10-27 NOTE — Progress Notes (Signed)
 Patient ID: Ricardo Lozano, male   DOB: 09-Oct-1965, 58 y.o.   MRN: [id number]   He did well with surgical fixation last night and he looks good today. He is swollen as expected. He is wired shut for 4 weeks.  He can be discharged today with wire cutters to stay with him at all times. He should be placed on AUgmentin liquid for 2 weeks and follow up in office next week. Liquid diet. Return to work when the neck has stopped draining. Call if any worsening pain or swelling. [phone].
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
## 2023-10-27 NOTE — Anesthesia Postprocedure Evaluation (Signed)
 Anesthesia Post Note  Patient: Ricardo Lozano  Procedure(s) Performed: OPEN REDUCTION INTERNAL FIXATION (ORIF) MANDIBULAR FRACTURE (Mouth) EXTRACTION, TOOTH, THIRD MOLAR (Right: Mouth)     Patient location during evaluation: PACU Anesthesia Type: General Level of consciousness: awake and alert Pain management: pain level controlled Vital Signs Assessment: post-procedure vital signs reviewed and stable Respiratory status: spontaneous breathing, nonlabored ventilation, respiratory function stable and patient connected to nasal cannula oxygen Cardiovascular status: blood pressure returned to baseline and stable Postop Assessment: no apparent nausea or vomiting Anesthetic complications: no   No notable events documented.              Azya Barbero D Alfreddie Consalvo

## 2023-10-28 ENCOUNTER — Encounter (HOSPITAL_COMMUNITY): Payer: Self-pay | Admitting: Otolaryngology

## 2023-10-28 ENCOUNTER — Encounter: Payer: Self-pay | Admitting: Family Medicine

## 2023-10-28 MED ORDER — OXYCODONE HCL 5 MG PO TABS: 5.0000 mg | ORAL_TABLET | ORAL | 0 refills | Status: DC | PRN

## 2023-10-28 MED ORDER — AMOXICILLIN-POT CLAVULANATE 400-57 MG/5ML PO SUSR: 800.0000 mg | Freq: Two times a day (BID) | ORAL | 0 refills | Status: DC

## 2023-10-28 MED ORDER — ACETAMINOPHEN 160 MG/5ML PO SOLN: 1000.0000 mg | Freq: Four times a day (QID) | ORAL | 0 refills | Status: DC | PRN

## 2023-10-28 MED ORDER — ACETAMINOPHEN 160 MG/5ML PO SOLN
1000.0000 mg | Freq: Four times a day (QID) | ORAL | Status: DC | PRN
  Administered 2023-10-28: 1000 mg via ORAL
  Filled 2023-10-28: qty 40.6

## 2023-10-28 MED ORDER — ACETAMINOPHEN 650 MG RE SUPP: 650.0000 mg | Freq: Four times a day (QID) | RECTAL | Status: DC | PRN

## 2023-10-28 NOTE — TOC Transition Note (Signed)
 Transition of Care [facility]) - Discharge Note   Patient Details  Name: Ricardo Lozano MRN: [id number] Date of Birth: [birth year]  Transition of Care [facility]) CM/SW Contact:  Tom-Johnson, Harvest Muskrat, RN Phone Number: 10/28/2023, 12:32 PM   Clinical Narrative:     Patient is scheduled for discharge today.  Readmission Risk Assessment done. New patient establishment, hospital f/u and discharge instructions on AVS. No ICM needs or recommendations noted. Cab voucher will be given at the discharge lounge to transport at discharge.  No further ICM needs noted.       Final next level of care: Home/Self Care Barriers to Discharge: Barriers Resolved   Patient Goals and CMS Choice Patient states their goals for this hospitalization and ongoing recovery are:: To return home CMS Medicare.gov Compare Post Acute Care list provided to:: Patient Choice offered to / list presented to : NA      Discharge Placement                Patient to be transferred to facility by: [facility]      Discharge Plan and Services Additional resources added to the After Visit Summary for                  DME Arranged: N/A DME Agency: NA       HH Arranged: NA HH Agency: NA        Social Drivers of Health (SDOH) Interventions SDOH Screenings   Transportation Needs: Not on File (08/10/2022)   Received from [employer] Strain: Not on File (08/10/2022)   Received from [facility]  Physical Activity: Not on File (08/10/2022)   Received from [facility]  Social Connections: Not on File (08/10/2022)   Received from [facility]  Stress: Not on File (08/10/2022)   Received from [facility]  Tobacco Use: Low Risk  (10/26/2023)     Readmission Risk Interventions    10/21/2023   12:56 PM  Readmission Risk Prevention Plan  Post Dischage Appt Complete  Medication Screening Complete  Transportation Screening Complete
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
## 2023-10-28 NOTE — Progress Notes (Signed)
 Patient has received his discharge papers and now getting to leave the floor. Patient has requested pharmacy to pick up medication be changed before leaving.  MD Notified.
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
## 2023-10-28 NOTE — Plan of Care (Signed)
  Problem: Skin Integrity: Goal: Skin integrity will improve Outcome: Progressing   Problem: Clinical Measurements: Goal: Diagnostic test results will improve Outcome: Progressing Goal: Cardiovascular complication will be avoided Outcome: Progressing

## 2023-11-05 DIAGNOSIS — S02609A Fracture of mandible, unspecified, initial encounter for closed fracture: Secondary | ICD-10-CM

## 2023-11-05 HISTORY — DX: Fracture of mandible, unspecified, initial encounter for closed fracture: S02.609A

## 2023-11-13 ENCOUNTER — Emergency Department (HOSPITAL_COMMUNITY)

## 2023-11-13 ENCOUNTER — Other Ambulatory Visit: Payer: Self-pay

## 2023-11-13 ENCOUNTER — Encounter (HOSPITAL_COMMUNITY): Payer: Self-pay

## 2023-11-13 ENCOUNTER — Inpatient Hospital Stay (HOSPITAL_COMMUNITY)
Admission: EM | Admit: 2023-11-13 | Discharge: 2023-11-14 | DRG: 183 | Disposition: A | Discharge status: Home / Self Care | Attending: General Surgery | Admitting: General Surgery

## 2023-11-13 DIAGNOSIS — R079 Chest pain, unspecified: Secondary | ICD-10-CM | POA: Diagnosis not present

## 2023-11-13 DIAGNOSIS — S0993XD Unspecified injury of face, subsequent encounter: Secondary | ICD-10-CM | POA: Diagnosis not present

## 2023-11-13 DIAGNOSIS — M25512 Pain in left shoulder: Secondary | ICD-10-CM | POA: Diagnosis present

## 2023-11-13 DIAGNOSIS — S272XXA Traumatic hemopneumothorax, initial encounter: Secondary | ICD-10-CM | POA: Diagnosis present

## 2023-11-13 DIAGNOSIS — F1210 Cannabis abuse, uncomplicated: Secondary | ICD-10-CM

## 2023-11-13 DIAGNOSIS — S2242XA Multiple fractures of ribs, left side, initial encounter for closed fracture: Secondary | ICD-10-CM | POA: Diagnosis present

## 2023-11-13 DIAGNOSIS — S2249XA Multiple fractures of ribs, unspecified side, initial encounter for closed fracture: Secondary | ICD-10-CM | POA: Diagnosis present

## 2023-11-13 DIAGNOSIS — S270XXA Traumatic pneumothorax, initial encounter: Secondary | ICD-10-CM

## 2023-11-13 DIAGNOSIS — S27321A Contusion of lung, unilateral, initial encounter: Secondary | ICD-10-CM | POA: Diagnosis present

## 2023-11-13 DIAGNOSIS — Y92410 Unspecified street and highway as the place of occurrence of the external cause: Secondary | ICD-10-CM | POA: Diagnosis not present

## 2023-11-13 DIAGNOSIS — Z7401 Bed confinement status: Secondary | ICD-10-CM | POA: Diagnosis not present

## 2023-11-13 HISTORY — DX: Cannabis abuse, uncomplicated: F12.10

## 2023-11-13 LAB — CBC
HCT: 36.1 % — ABNORMAL LOW (ref 39.0–52.0)
HCT: 34.7 % — ABNORMAL LOW (ref 39.0–52.0)
Hemoglobin: 12.2 g/dL — ABNORMAL LOW (ref 13.0–17.0)
Hemoglobin: 11.5 g/dL — ABNORMAL LOW (ref 13.0–17.0)
MCH: 31.4 pg (ref 26.0–34.0)
MCH: 30.3 pg (ref 26.0–34.0)
MCHC: 33.8 g/dL (ref 30.0–36.0)
MCHC: 33.1 g/dL (ref 30.0–36.0)
MCV: 93 fL (ref 80.0–100.0)
MCV: 91.3 fL (ref 80.0–100.0)
Platelets: 218 K/uL (ref 150–400)
Platelets: 202 K/uL (ref 150–400)
RBC: 3.88 MIL/uL — ABNORMAL LOW (ref 4.22–5.81)
RBC: 3.8 MIL/uL — ABNORMAL LOW (ref 4.22–5.81)
RDW: 12.8 % (ref 11.5–15.5)
RDW: 12.7 % (ref 11.5–15.5)
WBC: 8.2 K/uL (ref 4.0–10.5)
WBC: 9.8 K/uL (ref 4.0–10.5)
nRBC: 0 % (ref 0.0–0.2)
nRBC: 0 % (ref 0.0–0.2)

## 2023-11-13 LAB — COMPREHENSIVE METABOLIC PANEL WITH GFR
ALT: 15 U/L (ref 0–44)
AST: 21 U/L (ref 15–41)
Albumin: 4.1 g/dL (ref 3.5–5.0)
Alkaline Phosphatase: 81 U/L (ref 38–126)
Anion gap: 9 (ref 5–15)
BUN: 17 mg/dL (ref 6–20)
CO2: 26 mmol/L (ref 22–32)
Calcium: 8.9 mg/dL (ref 8.9–10.3)
Chloride: 106 mmol/L (ref 98–111)
Creatinine, Ser: 0.85 mg/dL (ref 0.61–1.24)
GFR, Estimated: >60 mL/min (ref >60)
Glucose, Bld: 100 mg/dL — ABNORMAL HIGH (ref 70–99)
Potassium: 4 mmol/L (ref 3.5–5.1)
Sodium: 141 mmol/L (ref 135–145)
Total Bilirubin: 0.5 mg/dL (ref 0.0–1.2)
Total Protein: 6.6 g/dL (ref 6.5–8.1)

## 2023-11-13 LAB — URINALYSIS, ROUTINE W REFLEX MICROSCOPIC
Bacteria, UA: NONE SEEN
Bilirubin Urine: NEGATIVE
Glucose, UA: NEGATIVE mg/dL
Ketones, ur: NEGATIVE mg/dL
Leukocytes,Ua: NEGATIVE
Nitrite: NEGATIVE
Protein, ur: NEGATIVE mg/dL
Specific Gravity, Urine: 1.009 (ref 1.005–1.030)
pH: 5 (ref 5.0–8.0)

## 2023-11-13 LAB — CREATININE, SERUM
Creatinine, Ser: 0.66 mg/dL (ref 0.61–1.24)
GFR, Estimated: >60 mL/min (ref >60)

## 2023-11-13 LAB — MAGNESIUM: Magnesium: 2.1 mg/dL (ref 1.7–2.4)

## 2023-11-13 MED ORDER — METHOCARBAMOL 1000 MG/10ML IJ SOLN: 500.0000 mg | Freq: Three times a day (TID) | INTRAMUSCULAR | Status: DC

## 2023-11-13 MED ORDER — OXYCODONE HCL 5 MG PO TABS
5.0000 mg | ORAL_TABLET | ORAL | Status: DC | PRN
  Administered 2023-11-13: 5 mg via ORAL
  Filled 2023-11-13: qty 1

## 2023-11-13 MED ORDER — METOPROLOL TARTRATE 5 MG/5ML IV SOLN: 5.0000 mg | Freq: Four times a day (QID) | INTRAVENOUS | Status: DC | PRN

## 2023-11-13 MED ORDER — METHOCARBAMOL 500 MG PO TABS
500.0000 mg | ORAL_TABLET | Freq: Three times a day (TID) | ORAL | Status: DC
  Administered 2023-11-13 – 2023-11-14 (×4): 500 mg via ORAL
  Filled 2023-11-13 (×4): qty 1

## 2023-11-13 MED ORDER — DOCUSATE SODIUM 100 MG PO CAPS
100.0000 mg | ORAL_CAPSULE | Freq: Two times a day (BID) | ORAL | Status: DC
  Administered 2023-11-13 – 2023-11-14 (×3): 100 mg via ORAL
  Filled 2023-11-13 (×3): qty 1

## 2023-11-13 MED ORDER — ONDANSETRON 4 MG PO TBDP: 4.0000 mg | ORAL_TABLET | Freq: Four times a day (QID) | ORAL | Status: DC | PRN

## 2023-11-13 MED ORDER — HYDRALAZINE HCL 20 MG/ML IJ SOLN: 10.0000 mg | INTRAMUSCULAR | Status: DC | PRN

## 2023-11-13 MED ORDER — ACETAMINOPHEN 500 MG PO TABS
1000.0000 mg | ORAL_TABLET | Freq: Four times a day (QID) | ORAL | Status: DC
  Administered 2023-11-13 – 2023-11-14 (×5): 1000 mg via ORAL
  Filled 2023-11-13 (×5): qty 2

## 2023-11-13 MED ORDER — ENOXAPARIN SODIUM 30 MG/0.3ML IJ SOSY: 30.0000 mg | PREFILLED_SYRINGE | Freq: Two times a day (BID) | INTRAMUSCULAR | Status: DC

## 2023-11-13 MED ORDER — ONDANSETRON HCL 4 MG/2ML IJ SOLN: 4.0000 mg | Freq: Four times a day (QID) | INTRAMUSCULAR | Status: DC | PRN

## 2023-11-13 MED ORDER — POLYETHYLENE GLYCOL 3350 17 G PO PACK
17.0000 g | PACK | Freq: Every day | ORAL | Status: DC | PRN
Start: 2023-11-13 — End: 2023-11-14

## 2023-11-13 MED ORDER — HYDROMORPHONE HCL 1 MG/ML IJ SOLN
1.0000 mg | Freq: Once | INTRAMUSCULAR | Status: AC
  Administered 2023-11-13: 1 mg via INTRAVENOUS
  Filled 2023-11-13: qty 1

## 2023-11-13 MED ORDER — HYDROMORPHONE HCL 1 MG/ML IJ SOLN: 1.0000 mg | INTRAMUSCULAR | Status: DC | PRN

## 2023-11-13 MED ORDER — ORAL CARE MOUTH RINSE: 15.0000 mL | OROMUCOSAL | Status: DC | PRN

## 2023-11-13 MED ORDER — IBUPROFEN 200 MG PO TABS
800.0000 mg | ORAL_TABLET | Freq: Three times a day (TID) | ORAL | Status: DC
  Administered 2023-11-13 – 2023-11-14 (×3): 800 mg via ORAL
  Filled 2023-11-13 (×3): qty 4

## 2023-11-13 MED ORDER — ENSURE PLUS HIGH PROTEIN PO LIQD
237.0000 mL | Freq: Two times a day (BID) | ORAL | Status: DC
  Administered 2023-11-13 – 2023-11-14 (×2): 237 mL via ORAL

## 2023-11-13 MED ORDER — IOHEXOL 300 MG/ML  SOLN
100.0000 mL | Freq: Once | INTRAMUSCULAR | Status: AC | PRN
  Administered 2023-11-13: 100 mL via INTRAVENOUS

## 2023-11-13 NOTE — TOC CAGE-AID Note (Signed)
 Transition of Care [facility]) - CAGE-AID Screening  Patient Details  Name: Ricardo Lozano MRN: [id number] Date of Birth: [birth year]  Clinical Narrative:  Patient denies any current alcohol use. Patient uses marijuana very infrequently but will occasionally smoke with friends. Patient denies any cigarette smoking. Patient denies need for substance abuse resources at this time.  CAGE-AID Screening:   Have You Ever Felt You Ought to Cut Down on Your Drinking or Drug Use?: No Have People Annoyed You By Critizing Your Drinking Or Drug Use?: No Have You Felt Bad Or Guilty About Your Drinking Or Drug Use?: No Have You Ever Had a Drink or Used Drugs First Thing In The Morning to Steady Your Nerves or to Get Rid of a Hangover?: No CAGE-AID Score: 0  Substance Abuse Education Offered: No
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
## 2023-11-14 ENCOUNTER — Other Ambulatory Visit (HOSPITAL_COMMUNITY): Payer: Self-pay

## 2023-11-14 ENCOUNTER — Inpatient Hospital Stay (HOSPITAL_COMMUNITY)

## 2023-11-14 LAB — CBC
HCT: 30.9 % — ABNORMAL LOW (ref 39.0–52.0)
Hemoglobin: 10.4 g/dL — ABNORMAL LOW (ref 13.0–17.0)
MCH: 30.8 pg (ref 26.0–34.0)
MCHC: 33.7 g/dL (ref 30.0–36.0)
MCV: 91.4 fL (ref 80.0–100.0)
Platelets: 193 K/uL (ref 150–400)
RBC: 3.38 MIL/uL — ABNORMAL LOW (ref 4.22–5.81)
RDW: 12.9 % (ref 11.5–15.5)
WBC: 6.9 K/uL (ref 4.0–10.5)
nRBC: 0 % (ref 0.0–0.2)

## 2023-11-14 LAB — BASIC METABOLIC PANEL WITH GFR
Anion gap: 9 (ref 5–15)
BUN: 10 mg/dL (ref 6–20)
CO2: 26 mmol/L (ref 22–32)
Calcium: 8.5 mg/dL — ABNORMAL LOW (ref 8.9–10.3)
Chloride: 105 mmol/L (ref 98–111)
Creatinine, Ser: 0.79 mg/dL (ref 0.61–1.24)
GFR, Estimated: >60 mL/min (ref >60)
Glucose, Bld: 101 mg/dL — ABNORMAL HIGH (ref 70–99)
Potassium: 3.8 mmol/L (ref 3.5–5.1)
Sodium: 140 mmol/L (ref 135–145)

## 2023-11-14 MED ORDER — OXYCODONE HCL 5 MG PO TABS
5.0000 mg | ORAL_TABLET | Freq: Four times a day (QID) | ORAL | 0 refills | Status: DC | PRN
  Filled 2023-11-14: qty 20, 5d supply, fill #0

## 2023-11-14 MED ORDER — IBUPROFEN 200 MG PO TABS
600.0000 mg | ORAL_TABLET | Freq: Three times a day (TID) | ORAL | 0 refills | Status: AC | PRN
  Filled 2023-11-14: qty 50, 4d supply, fill #0

## 2023-11-14 MED ORDER — POLYETHYLENE GLYCOL 3350 17 G PO PACK
17.0000 g | PACK | Freq: Every day | ORAL | Status: DC
  Filled 2023-11-14: qty 1

## 2023-11-14 MED ORDER — POLYETHYLENE GLYCOL 3350 17 G PO PACK: 17.0000 g | PACK | Freq: Two times a day (BID) | ORAL | Status: DC

## 2023-11-14 MED ORDER — METHOCARBAMOL 500 MG PO TABS
500.0000 mg | ORAL_TABLET | Freq: Three times a day (TID) | ORAL | 0 refills | Status: DC | PRN
  Filled 2023-11-14: qty 50, 17d supply, fill #0

## 2023-11-14 MED ORDER — DOCUSATE SODIUM 100 MG PO CAPS
100.0000 mg | ORAL_CAPSULE | Freq: Two times a day (BID) | ORAL | 0 refills | Status: DC
  Filled 2023-11-14: qty 10, 5d supply, fill #0
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
## 2023-11-14 NOTE — Progress Notes (Signed)
 Order to discharge patient home. Family is notified. Discharge instructions/AVS given to and reviewed with patient. Education provided as needed. Patient verbalized understanding. 1 PIV removed by this RN. Personal belongings and TOC meds sent home with the patient. Home via private vehicle with daughter-in-law.
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
## 2023-11-23 ENCOUNTER — Ambulatory Visit (INDEPENDENT_AMBULATORY_CARE_PROVIDER_SITE_OTHER): Payer: MEDICAID | Admitting: Otolaryngology

## 2023-11-23 VITALS — BP 145/77 | HR 61 | Temp 97.3°F | Ht 72.0 in | Wt 154.0 lb

## 2023-11-23 DIAGNOSIS — Z09 Encounter for follow-up examination after completed treatment for conditions other than malignant neoplasm: Secondary | ICD-10-CM

## 2023-11-23 DIAGNOSIS — S02609D Fracture of mandible, unspecified, subsequent encounter for fracture with routine healing: Secondary | ICD-10-CM
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
## 2023-11-25 ENCOUNTER — Ambulatory Visit: Payer: Self-pay | Admitting: Physician Assistant

## 2023-12-07 ENCOUNTER — Ambulatory Visit (INDEPENDENT_AMBULATORY_CARE_PROVIDER_SITE_OTHER): Payer: MEDICAID | Admitting: Otolaryngology

## 2023-12-07 ENCOUNTER — Encounter (INDEPENDENT_AMBULATORY_CARE_PROVIDER_SITE_OTHER): Payer: Self-pay | Admitting: Otolaryngology

## 2023-12-07 VITALS — BP 133/83 | HR 70 | Temp 97.6°F

## 2023-12-07 DIAGNOSIS — Z09 Encounter for follow-up examination after completed treatment for conditions other than malignant neoplasm: Secondary | ICD-10-CM

## 2023-12-07 DIAGNOSIS — S02609D Fracture of mandible, unspecified, subsequent encounter for fracture with routine healing: Secondary | ICD-10-CM

## 2023-12-07 NOTE — Progress Notes (Signed)
 He is here for removal of the arch bars.  He is doing well.  He is eating solid foods at this point even though that was not the instructions.  No pain.  I attempted to remove 1 screw and he could not tolerate it.  I will schedule him for a MAC procedure in the outpatient center next week and he is a good with that decision.  We discussed the procedure including risk, benefits, and options.  All his questions were answered and consent was obtained.

## 2023-12-07 NOTE — Addendum Note (Signed)
 Addended by: ROARK NORLEEN HERO on: 12/07/2023 02:44 PM   Modules accepted: Orders

## 2023-12-08 ENCOUNTER — Other Ambulatory Visit: Payer: Self-pay

## 2023-12-08 ENCOUNTER — Encounter (HOSPITAL_BASED_OUTPATIENT_CLINIC_OR_DEPARTMENT_OTHER): Payer: Self-pay | Admitting: Emergency Medicine

## 2023-12-14 HISTORY — DX: Fracture of one rib, unspecified side, initial encounter for closed fracture: S22.39XA

## 2023-12-20 NOTE — Progress Notes (Addendum)
 Received phone call  on 12/20/23 from patient  stating he had been called by a manager at this facility possibly in follow up from a previous call from an employee here. He stated his surgery has been rescheduled a number of times and he could not arrive eany earlier on 12/21/23 due to his family obligations. He stated he works 3rd shift and does not want to receive calls before noon and to make a not in his chart as well as a not that he does not want to be rescheduled that the hardware in his mouth is beginning to bother him. Left message via secure chat to  office. Also placed note in FYI.

## 2024-01-04 ENCOUNTER — Encounter (HOSPITAL_BASED_OUTPATIENT_CLINIC_OR_DEPARTMENT_OTHER): Payer: Self-pay | Admitting: *Deleted

## 2024-01-04 ENCOUNTER — Other Ambulatory Visit: Payer: Self-pay

## 2024-01-11 ENCOUNTER — Other Ambulatory Visit: Payer: Self-pay

## 2024-01-11 ENCOUNTER — Ambulatory Visit (HOSPITAL_BASED_OUTPATIENT_CLINIC_OR_DEPARTMENT_OTHER): Payer: Self-pay | Admitting: Anesthesiology

## 2024-01-11 ENCOUNTER — Ambulatory Visit (HOSPITAL_COMMUNITY)
Admission: RE | Admit: 2024-01-11 | Discharge: 2024-01-11 | Disposition: A | Discharge status: Home / Self Care | Payer: Self-pay | Source: Home / Self Care | Attending: Otolaryngology | Admitting: Otolaryngology

## 2024-01-11 ENCOUNTER — Encounter (HOSPITAL_BASED_OUTPATIENT_CLINIC_OR_DEPARTMENT_OTHER)
Admission: RE | Disposition: A | Discharge status: Home / Self Care | Payer: Self-pay | Source: Home / Self Care | Attending: Otolaryngology

## 2024-01-11 ENCOUNTER — Encounter (HOSPITAL_BASED_OUTPATIENT_CLINIC_OR_DEPARTMENT_OTHER): Payer: Self-pay

## 2024-01-11 DIAGNOSIS — Z472 Encounter for removal of internal fixation device: Secondary | ICD-10-CM

## 2024-01-11 DIAGNOSIS — Z4589 Encounter for adjustment and management of other implanted devices: Secondary | ICD-10-CM | POA: Insufficient documentation

## 2024-01-11 DIAGNOSIS — S02609D Fracture of mandible, unspecified, subsequent encounter for fracture with routine healing: Secondary | ICD-10-CM

## 2024-01-11 DIAGNOSIS — Z8781 Personal history of (healed) traumatic fracture: Secondary | ICD-10-CM | POA: Insufficient documentation

## 2024-01-11 HISTORY — DX: Fracture of one rib, unspecified side, initial encounter for closed fracture: S22.39XA

## 2024-01-11 HISTORY — PX: MANDIBULAR HARDWARE REMOVAL: SHX5205

## 2024-01-11 MED ORDER — LACTATED RINGERS IV SOLN: INTRAVENOUS | Status: DC | PRN

## 2024-01-11 MED ORDER — LACTATED RINGERS IV SOLN: INTRAVENOUS | Status: DC

## 2024-01-11 MED ORDER — DEXAMETHASONE SOD PHOSPHATE PF 10 MG/ML IJ SOLN
INTRAMUSCULAR | Status: AC
  Filled 2024-01-11: qty 1

## 2024-01-11 MED ORDER — DEXMEDETOMIDINE HCL IN NACL 80 MCG/20ML IV SOLN
INTRAVENOUS | Status: AC
  Filled 2024-01-11: qty 20

## 2024-01-11 MED ORDER — LIDOCAINE-EPINEPHRINE 1 %-1:100000 IJ SOLN
INTRAMUSCULAR | Status: AC
  Filled 2024-01-11: qty 1

## 2024-01-11 MED ORDER — PROPOFOL 10 MG/ML IV BOLUS
INTRAVENOUS | Status: AC
  Filled 2024-01-11: qty 20

## 2024-01-11 MED ORDER — FENTANYL CITRATE (PF) 100 MCG/2ML IJ SOLN: 25.0000 ug | INTRAMUSCULAR | Status: DC | PRN

## 2024-01-11 MED ORDER — MIDAZOLAM HCL 2 MG/2ML IJ SOLN
INTRAMUSCULAR | Status: AC
  Filled 2024-01-11: qty 2

## 2024-01-11 MED ORDER — MIDAZOLAM HCL (PF) 2 MG/2ML IJ SOLN
INTRAMUSCULAR | Status: DC | PRN
  Administered 2024-01-11: 2 mg via INTRAVENOUS

## 2024-01-11 MED ORDER — OXYCODONE HCL 5 MG PO TABS: 5.0000 mg | ORAL_TABLET | Freq: Once | ORAL | Status: DC | PRN

## 2024-01-11 MED ORDER — ONDANSETRON HCL 4 MG/2ML IJ SOLN
INTRAMUSCULAR | Status: AC
  Filled 2024-01-11: qty 2

## 2024-01-11 MED ORDER — ACETAMINOPHEN 500 MG PO TABS
ORAL_TABLET | ORAL | Status: AC
  Filled 2024-01-11: qty 2

## 2024-01-11 MED ORDER — OXYCODONE HCL 5 MG/5ML PO SOLN: 5.0000 mg | Freq: Once | ORAL | Status: DC | PRN

## 2024-01-11 MED ORDER — FENTANYL CITRATE (PF) 100 MCG/2ML IJ SOLN
INTRAMUSCULAR | Status: DC | PRN
  Administered 2024-01-11: 100 ug via INTRAVENOUS

## 2024-01-11 MED ORDER — PROPOFOL 500 MG/50ML IV EMUL
INTRAVENOUS | Status: DC | PRN
  Administered 2024-01-11: 200 ug/kg/min via INTRAVENOUS

## 2024-01-11 MED ORDER — AMISULPRIDE (ANTIEMETIC) 5 MG/2ML IV SOLN: 10.0000 mg | Freq: Once | INTRAVENOUS | Status: DC | PRN

## 2024-01-11 MED ORDER — ACETAMINOPHEN 500 MG PO TABS
1000.0000 mg | ORAL_TABLET | Freq: Once | ORAL | Status: AC
  Administered 2024-01-11: 1000 mg via ORAL

## 2024-01-11 MED ORDER — FENTANYL CITRATE (PF) 100 MCG/2ML IJ SOLN
INTRAMUSCULAR | Status: AC
  Filled 2024-01-11: qty 2

## 2024-01-11 MED ORDER — KETOROLAC TROMETHAMINE 30 MG/ML IJ SOLN: 30.0000 mg | Freq: Once | INTRAMUSCULAR | Status: DC | PRN

## 2024-01-11 MED ORDER — ONDANSETRON HCL 4 MG/2ML IJ SOLN
INTRAMUSCULAR | Status: DC | PRN
  Administered 2024-01-11: 4 mg via INTRAVENOUS

## 2024-01-11 NOTE — Op Note (Signed)
 Preop/postop diagnosis: Retention of hardware  Procedure: Removal of hardware Anesthesia: General Estimated blood loss less than 10 cc Indications: Patient with a history of mandible fracture and was placed in MMF with arch bars and now has seemed to heal with good occlusion and no pain.  He now needs arch bars off and he would not tolerate that in the office.  He is informed the risk and benefits of the procedure and options were discussed all questions were answered and consent was obtained. Procedure: Patient was taken the op room placed supine position after general anesthesia the patient was examined of the mouth.  The 4 upper screws were removed and the arch bar with it.  The lower 4 screws were removed and the arch bar removed.  There was some irritation of the right upper maxilla area but there was minimal bleeding.  The patient seemed to tolerate the procedure well.  The oropharynx oral cavity was suctioned out all blood

## 2024-01-11 NOTE — Anesthesia Postprocedure Evaluation (Addendum)
"   Anesthesia Post Note  Patient: Ricardo Lozano  Procedure(s) Performed: REMOVAL, HARDWARE, MANDIBLE (Right: Mouth)     Patient location during evaluation: PACU Anesthesia Type: MAC Level of consciousness: awake and alert Pain management: pain level controlled Vital Signs Assessment: post-procedure vital signs reviewed and stable Respiratory status: spontaneous breathing, nonlabored ventilation and respiratory function stable Cardiovascular status: blood pressure returned to baseline and stable Postop Assessment: no apparent nausea or vomiting Anesthetic complications: no   No notable events documented.  Last Vitals:  Vitals:   01/11/24 1400 01/11/24 1430  BP: (!) 138/95 (!) 162/85  Pulse: (!) 58 62  Resp: 18 18  Temp:  (!) 36.3 C  SpO2: 97% 97%    Last Pain:  Vitals:   01/11/24 1430  TempSrc:   PainSc: 0-No pain                 Ricardo Lozano      "

## 2024-01-11 NOTE — Transfer of Care (Signed)
 Immediate Anesthesia Transfer of Care Note  Patient: Ricardo Lozano  Procedure(s) Performed: REMOVAL, HARDWARE, MANDIBLE (Right)  Patient Location: PACU  Anesthesia Type:MAC  Level of Consciousness: awake, alert , and oriented  Airway & Oxygen Therapy: Patient Spontanous Breathing and Patient connected to face mask oxygen  Post-op Assessment: Report given to RN and Post -op Vital signs reviewed and stable  Post vital signs: Reviewed and stable  Last Vitals:  Vitals Value Taken Time  BP 127/85 01/11/24 13:35  Temp    Pulse 56 01/11/24 13:37  Resp 10 01/11/24 13:37  SpO2 97 % 01/11/24 13:37  Vitals shown include unfiled device data.  Last Pain:  Vitals:   01/11/24 1108  TempSrc: Temporal  PainSc: 0-No pain      Patients Stated Pain Goal: 3 (01/11/24 1108)  Complications: No notable events documented.

## 2024-01-11 NOTE — Anesthesia Preprocedure Evaluation (Addendum)
"                                    Anesthesia Evaluation  Patient identified by MRN, date of birth, ID band Patient awake    Reviewed: Allergy & Precautions, NPO status , Patient's Chart, lab work & pertinent test results  Airway Mallampati: III  TM Distance: >3 FB Neck ROM: Full    Dental no notable dental hx.    Pulmonary neg pulmonary ROS   Pulmonary exam normal        Cardiovascular negative cardio ROS Normal cardiovascular exam     Neuro/Psych  PSYCHIATRIC DISORDERS      negative neurological ROS     GI/Hepatic negative GI ROS,,,  Endo/Other  negative endocrine ROS    Renal/GU negative Renal ROS     Musculoskeletal   Abdominal   Peds  Hematology negative hematology ROS (+)   Anesthesia Other Findings Open fracture of right side of mandible with routine healing, unspecified mandibular site, subsequent encounter  Reproductive/Obstetrics                              Anesthesia Physical Anesthesia Plan  ASA: 2  Anesthesia Plan: MAC   Post-op Pain Management:    Induction: Intravenous  PONV Risk Score and Plan: 1 and Ondansetron , Dexamethasone , Midazolam , Treatment may vary due to age or medical condition and Propofol  infusion  Airway Management Planned: Nasal Cannula  Additional Equipment:   Intra-op Plan:   Post-operative Plan:   Informed Consent: I have reviewed the patients History and Physical, chart, labs and discussed the procedure including the risks, benefits and alternatives for the proposed anesthesia with the patient or authorized representative who has indicated his/her understanding and acceptance.     Dental advisory given  Plan Discussed with: CRNA and Surgeon  Anesthesia Plan Comments:          Anesthesia Quick Evaluation  "

## 2024-01-11 NOTE — H&P (Signed)
 " Reason for Consult:hardware Referring Physician: none  Ricardo Lozano is an 59 y.o. male.  HPI: hx of mandible fracture and now healed and needs hardware out.   Past Medical History:  Diagnosis Date   H/O foot surgery    Mandible fracture (HCC) 11/2023   Rib fracture    Tetrahydrocannabinol (THC) use disorder, mild, abuse 11/13/2023   2x/month    Past Surgical History:  Procedure Laterality Date   IRRIGATION AND DEBRIDEMENT ABSCESS N/A 10/20/2023   Procedure: IRRIGATION AND DEBRIDEMENT ABSCESS;  Surgeon: Roark Rush, MD;  Location: [facility] OR;  Service: ENT;  Laterality: N/A;   ORIF HUMERUS FRACTURE Left 07/16/2020   Procedure: OPEN REDUCTION INTERNAL FIXATION (ORIF) DISTAL HUMERUS FRACTURE;  Surgeon: Kendal Franky SQUIBB, MD;  Location: [facility] OR;  Service: Orthopedics;  Laterality: Left;   ORIF MANDIBULAR FRACTURE N/A 10/20/2023   Procedure: OPEN REDUCTION INTERNAL FIXATION (ORIF) MANDIBULAR FRACTURE;  Surgeon: Roark Rush, MD;  Location: [facility] OR;  Service: ENT;  Laterality: N/A;   ORIF MANDIBULAR FRACTURE N/A 10/26/2023   Procedure: OPEN REDUCTION INTERNAL FIXATION (ORIF) MANDIBULAR FRACTURE;  Surgeon: Roark Rush, MD;  Location: [facility] OR;  Service: ENT;  Laterality: N/A;   TOOTH EXTRACTION Right 10/26/2023   Procedure: EXTRACTION, TOOTH, THIRD MOLAR;  Surgeon: Helga Bettyann SQUIBB, DMD;  Location: [facility] OR;  Service: Oral Surgery;  Laterality: Right;    History reviewed. No pertinent family history.  Social History:  reports that he has never smoked. He has never used smokeless tobacco. He reports that he does not currently use alcohol. He reports current drug use. Drug: Marijuana.  Allergies: Allergies[1]   No results found for this or any previous visit (from the past 48 hours).  No results found.  ROS Blood pressure (!) 127/91, pulse 67, temperature 98.5 F (36.9 C), temperature source Temporal, height 6' (1.829 m), weight 73.2 kg, SpO2 98%. Physical Exam Constitutional:       Appearance: Normal appearance.  HENT:     Head: Normocephalic and atraumatic.     Right Ear: Tympanic membrane is without lesions and middle ear aerated, ear canal and external ear normal.     Left Ear: Tympanic membrane is without lesions and middle ear aerated, ear canal and external ear normal.     Nose: Nose without deviation of septum.  Turbinates with mild hypertrophy, No significant swelling or masses.     Oral cavity/oropharynx: his normal occlusion and no pain. Mucous membranes are moist. No lesions or masses    Larynx: normal voice. Mirror attempted without success    Eyes:     Extraocular Movements: Extraocular movements intact.     Conjunctiva/sclera: Conjunctivae normal.     Pupils: Pupils are equal, round, and reactive to light.  Cardiovascular:     Rate and Rhythm: Normal rate.  Pulmonary:     Effort: Pulmonary effort is normal.  Musculoskeletal:     Cervical back: Normal range of motion and neck supple. No rigidity.  Lymphadenopathy:     Cervical: No cervical adenopathy or masses.salivary glands without lesions. .     Salivary glands- no mass or swelling Neurological:     Mental Status: He is alert. CN 2-12 intact. No nystagmus      Assessment/Plan: Mandible fracture- he is doinf well and seems to have healed with no pain and back to his occlusion. He is ready for hardware removal and discussed risks benefits and options. All questions answered and consent obtained   Rush Roark 01/11/2024, 1:10  PM        [1] No Known Allergies  "

## 2024-01-11 NOTE — Discharge Instructions (Signed)
 May take Tylenol  after 5:45 pm, if needed.    Post Anesthesia Home Care Instructions  Activity: Get plenty of rest for the remainder of the day. A responsible individual must stay with you for 24 hours following the procedure.  For the next 24 hours, DO NOT: -Drive a car -Advertising copywriter -Drink alcoholic beverages -Take any medication unless instructed by your physician -Make any legal decisions or sign important papers.  Meals: Start with liquid foods such as gelatin or soup. Progress to regular foods as tolerated. Avoid greasy, spicy, heavy foods. If nausea and/or vomiting occur, drink only clear liquids until the nausea and/or vomiting subsides. Call your physician if vomiting continues.  Special Instructions/Symptoms: Your throat may feel dry or sore from the anesthesia or the breathing tube placed in your throat during surgery. If this causes discomfort, gargle with warm salt water. The discomfort should disappear within 24 hours.  If you had a scopolamine patch placed behind your ear for the management of post- operative nausea and/or vomiting:  1. The medication in the patch is effective for 72 hours, after which it should be removed.  Wrap patch in a tissue and discard in the trash. Wash hands thoroughly with soap and water. 2. You may remove the patch earlier than 72 hours if you experience unpleasant side effects which may include dry mouth, dizziness or visual disturbances. 3. Avoid touching the patch. Wash your hands with soap and water after contact with the patch.

## 2024-01-13 ENCOUNTER — Encounter (HOSPITAL_BASED_OUTPATIENT_CLINIC_OR_DEPARTMENT_OTHER): Payer: Self-pay | Admitting: Otolaryngology
